# Patient Record
Sex: Female | Born: 1985 | Race: White | Hispanic: No | Marital: Married | Smoking: Current some day smoker
Health system: Southern US, Community
[De-identification: ages and names within clinical notes are randomized; demographics above are authoritative.]

## PROBLEM LIST (undated history)

## (undated) ENCOUNTER — Inpatient Hospital Stay (HOSPITAL_COMMUNITY): Payer: Self-pay

## (undated) DIAGNOSIS — Z789 Other specified health status: Secondary | ICD-10-CM

## (undated) DIAGNOSIS — D696 Thrombocytopenia, unspecified: Secondary | ICD-10-CM

## (undated) HISTORY — PX: NO PAST SURGERIES: SHX2092

## (undated) HISTORY — PX: OTHER SURGICAL HISTORY: SHX169

---

## 2006-02-19 ENCOUNTER — Emergency Department (HOSPITAL_COMMUNITY): Admission: EM | Admit: 2006-02-19 | Discharge: 2006-02-19 | Payer: Self-pay | Admitting: Emergency Medicine

## 2007-09-11 ENCOUNTER — Emergency Department (HOSPITAL_COMMUNITY): Admission: EM | Admit: 2007-09-11 | Discharge: 2007-09-12 | Payer: Self-pay | Admitting: Emergency Medicine

## 2009-01-06 ENCOUNTER — Ambulatory Visit (HOSPITAL_COMMUNITY): Admission: RE | Admit: 2009-01-06 | Discharge: 2009-01-06 | Payer: Self-pay | Admitting: Obstetrics

## 2009-03-30 ENCOUNTER — Ambulatory Visit (HOSPITAL_COMMUNITY): Admission: RE | Admit: 2009-03-30 | Discharge: 2009-03-30 | Payer: Self-pay | Admitting: Obstetrics

## 2009-04-23 ENCOUNTER — Inpatient Hospital Stay (HOSPITAL_COMMUNITY): Admission: AD | Admit: 2009-04-23 | Discharge: 2009-04-23 | Payer: Self-pay | Admitting: Obstetrics

## 2009-05-18 ENCOUNTER — Inpatient Hospital Stay (HOSPITAL_COMMUNITY): Admission: AD | Admit: 2009-05-18 | Discharge: 2009-05-18 | Payer: Self-pay | Admitting: Obstetrics

## 2009-06-07 ENCOUNTER — Inpatient Hospital Stay (HOSPITAL_COMMUNITY): Admission: RE | Admit: 2009-06-07 | Discharge: 2009-06-10 | Payer: Self-pay | Admitting: Obstetrics

## 2010-09-25 LAB — CBC
Hemoglobin: 10.8 g/dL — ABNORMAL LOW (ref 12.0–15.0)
MCV: 93.6 fL (ref 78.0–100.0)
Platelets: 62 10*3/uL — ABNORMAL LOW (ref 150–400)
RBC: 3.48 MIL/uL — ABNORMAL LOW (ref 3.87–5.11)
WBC: 12 10*3/uL — ABNORMAL HIGH (ref 4.0–10.5)

## 2010-09-25 LAB — PLATELET COUNT: Platelets: 80 10*3/uL — ABNORMAL LOW (ref 150–400)

## 2010-09-26 LAB — CBC
HCT: 31.9 % — ABNORMAL LOW (ref 36.0–46.0)
HCT: 33 % — ABNORMAL LOW (ref 36.0–46.0)
HCT: 36.6 % (ref 36.0–46.0)
Hemoglobin: 12.4 g/dL (ref 12.0–15.0)
MCHC: 33.8 g/dL (ref 30.0–36.0)
MCHC: 34.2 g/dL (ref 30.0–36.0)
MCV: 92.1 fL (ref 78.0–100.0)
MCV: 92.8 fL (ref 78.0–100.0)
Platelets: 55 10*3/uL — ABNORMAL LOW (ref 150–400)
Platelets: 62 10*3/uL — ABNORMAL LOW (ref 150–400)
Platelets: 77 10*3/uL — ABNORMAL LOW (ref 150–400)
RBC: 3.45 MIL/uL — ABNORMAL LOW (ref 3.87–5.11)
RDW: 13.1 % (ref 11.5–15.5)
RDW: 13.2 % (ref 11.5–15.5)
RDW: 13.3 % (ref 11.5–15.5)
WBC: 19.9 10*3/uL — ABNORMAL HIGH (ref 4.0–10.5)

## 2010-09-28 LAB — URINALYSIS, ROUTINE W REFLEX MICROSCOPIC: pH: 5 (ref 5.0–8.0)

## 2010-09-28 LAB — WET PREP, GENITAL: Trich, Wet Prep: NONE SEEN

## 2010-09-28 LAB — FETAL FIBRONECTIN: Fetal Fibronectin: NEGATIVE

## 2010-09-28 LAB — GC/CHLAMYDIA PROBE AMP, GENITAL: GC Probe Amp, Genital: NEGATIVE

## 2012-02-19 ENCOUNTER — Encounter (HOSPITAL_COMMUNITY): Payer: Self-pay | Admitting: *Deleted

## 2012-02-19 ENCOUNTER — Inpatient Hospital Stay (HOSPITAL_COMMUNITY)
Admission: AD | Admit: 2012-02-19 | Discharge: 2012-02-19 | Disposition: A | Discharge status: Home / Self Care | Payer: Medicaid Other | Source: Ambulatory Visit | Attending: Obstetrics & Gynecology | Admitting: Obstetrics & Gynecology

## 2012-02-19 DIAGNOSIS — O210 Mild hyperemesis gravidarum: Secondary | ICD-10-CM | POA: Insufficient documentation

## 2012-02-19 DIAGNOSIS — Z3201 Encounter for pregnancy test, result positive: Secondary | ICD-10-CM

## 2012-02-19 HISTORY — DX: Other specified health status: Z78.9

## 2012-02-19 MED ORDER — METOCLOPRAMIDE HCL 10 MG PO TABS: 10.0000 mg | ORAL_TABLET | Freq: Four times a day (QID) | ORAL | Status: DC

## 2012-02-19 NOTE — MAU Provider Note (Signed)
  History     CSN: [id number]  Arrival date and time: 02/19/12 1353   None     Chief Complaint  Patient presents with  . Possible Pregnancy  . Nausea   HPI 26 y.o. G2P1001 at [redacted]w[redacted]d requesting pregnancy verification, C/O nausea, occasional vomiting, able to eat and drink normally.   Past Medical History  Diagnosis Date  . No pertinent past medical history     Past Surgical History  Procedure Date  . No past surgeries     History reviewed. No pertinent family history.  History  Substance Use Topics  . Smoking status: Former Games developer  . Smokeless tobacco: Not on file  . Alcohol Use: Yes     occasional wine    Allergies: No Known Allergies  No prescriptions prior to admission    Review of Systems  Constitutional: Negative.   Respiratory: Negative.   Cardiovascular: Negative.   Gastrointestinal: Negative for nausea, vomiting, abdominal pain, diarrhea and constipation.  Genitourinary: Negative for dysuria, urgency, frequency, hematuria and flank pain.       Negative for vaginal bleeding, vaginal discharge, dyspareunia  Musculoskeletal: Negative.   Neurological: Negative.   Psychiatric/Behavioral: Negative.    Physical Exam   Blood pressure 115/67, pulse 83, temperature 98.3 F (36.8 C), temperature source Oral, resp. rate 16, height 5\' 7"  (1.702 m), weight 130 lb (58.968 kg), last menstrual period 12/09/2011, SpO2 100.00%.  Physical Exam  Nursing note and vitals reviewed. Constitutional: She is oriented to person, place, and time. She appears well-developed and well-nourished. No distress.  Cardiovascular: Normal rate.   Respiratory: Effort normal.  Musculoskeletal: Normal range of motion.  Neurological: She is alert and oriented to person, place, and time.  Skin: Skin is warm and dry.  Psychiatric: She has a normal mood and affect.    MAU Course  Procedures  Results for orders placed during the hospital encounter of 02/19/12 (from the past 72 hour(s))    POCT PREGNANCY, URINE     Status: Abnormal   Collection Time   02/19/12  2:32 PM      Component Value Range Comment   Preg Test, Ur POSITIVE (*) NEGATIVE      Assessment and Plan  25 y.o. G2P1001 at 110w2d Nausea of pregnancy - rx reglan 10 mg i po qid PRN n/v Pregnancy verification given, start prenatal care as soon as possible  Shelagh Rayman 02/19/2012, 3:03 PM

## 2012-02-19 NOTE — MAU Note (Signed)
Patient states she had had multiple positive home pregnancy tests. Has had nausea and one episode of vomiting this am. Needs a confirmation letter to apply for Medicaid. No pain or bleeding.

## 2012-02-19 NOTE — MAU Provider Note (Signed)
Attestation of Attending Supervision of Advanced Practitioner (CNM/NP): Evaluation and management procedures were performed by the Advanced Practitioner under my supervision and collaboration.  I have reviewed the Advanced Practitioner's note and chart, and I agree with the management and plan.  HARRAWAY-SMITH, Kenzey Birkland 6:51 PM     

## 2012-04-14 LAB — OB RESULTS CONSOLE ANTIBODY SCREEN: Antibody Screen: NEGATIVE

## 2012-04-14 LAB — OB RESULTS CONSOLE ABO/RH: RH Type: POSITIVE

## 2012-04-14 LAB — OB RESULTS CONSOLE HEPATITIS B SURFACE ANTIGEN: Hepatitis B Surface Ag: NEGATIVE

## 2012-04-14 LAB — OB RESULTS CONSOLE RUBELLA ANTIBODY, IGM: Rubella: IMMUNE

## 2012-06-25 NOTE — L&D Delivery Note (Signed)
Patient was C/C/+1 and pushed for 5 minutes with no epidural.   NSVD female infant, Apgars 8/9, weight pending.   The patient had a second degree laceration repaired with vicryl. Fundus was firm. EBL was expected. Placenta was delivered intact. Vagina was clear.  Baby was vigorous to bedside.  Philip Aspen

## 2012-09-19 ENCOUNTER — Encounter (HOSPITAL_COMMUNITY): Payer: Self-pay | Admitting: *Deleted

## 2012-09-19 ENCOUNTER — Inpatient Hospital Stay (HOSPITAL_COMMUNITY)
Admission: AD | Admit: 2012-09-19 | Discharge: 2012-09-20 | DRG: 775 | Disposition: A | Discharge status: Home / Self Care | Payer: Medicaid Other | Source: Ambulatory Visit | Attending: Obstetrics and Gynecology | Admitting: Obstetrics and Gynecology

## 2012-09-19 DIAGNOSIS — O9912 Other diseases of the blood and blood-forming organs and certain disorders involving the immune mechanism complicating childbirth: Secondary | ICD-10-CM | POA: Diagnosis present

## 2012-09-19 DIAGNOSIS — D696 Thrombocytopenia, unspecified: Secondary | ICD-10-CM | POA: Diagnosis present

## 2012-09-19 DIAGNOSIS — D689 Coagulation defect, unspecified: Secondary | ICD-10-CM | POA: Diagnosis present

## 2012-09-19 DIAGNOSIS — Z3480 Encounter for supervision of other normal pregnancy, unspecified trimester: Secondary | ICD-10-CM

## 2012-09-19 HISTORY — DX: Other specified health status: Z78.9

## 2012-09-19 HISTORY — DX: Thrombocytopenia, unspecified: D69.6

## 2012-09-19 LAB — CBC
Hemoglobin: 11.9 g/dL — ABNORMAL LOW (ref 12.0–15.0)
MCH: 29.4 pg (ref 26.0–34.0)
MCV: 86.7 fL (ref 78.0–100.0)
Platelets: 78 10*3/uL — ABNORMAL LOW (ref 150–400)
RBC: 4.05 MIL/uL (ref 3.87–5.11)
RDW: 13 % (ref 11.5–15.5)

## 2012-09-19 LAB — PREPARE RBC (CROSSMATCH)

## 2012-09-19 MED ORDER — ACETAMINOPHEN 325 MG PO TABS: 650.0000 mg | ORAL_TABLET | ORAL | Status: DC | PRN

## 2012-09-19 MED ORDER — SIMETHICONE 80 MG PO CHEW: 80.0000 mg | CHEWABLE_TABLET | ORAL | Status: DC | PRN

## 2012-09-19 MED ORDER — FLEET ENEMA 7-19 GM/118ML RE ENEM: 1.0000 | ENEMA | Freq: Once | RECTAL | Status: DC

## 2012-09-19 MED ORDER — BUTORPHANOL TARTRATE 1 MG/ML IJ SOLN
1.0000 mg | INTRAMUSCULAR | Status: DC | PRN
  Administered 2012-09-19: 1 mg via INTRAVENOUS
  Filled 2012-09-19: qty 1

## 2012-09-19 MED ORDER — EPHEDRINE 5 MG/ML INJ
10.0000 mg | INTRAVENOUS | Status: DC | PRN
  Filled 2012-09-19: qty 2
  Filled 2012-09-19 (×2): qty 4

## 2012-09-19 MED ORDER — LIDOCAINE HCL (PF) 1 % IJ SOLN
30.0000 mL | INTRAMUSCULAR | Status: DC | PRN
  Administered 2012-09-19: 30 mL via SUBCUTANEOUS
  Filled 2012-09-19 (×2): qty 30

## 2012-09-19 MED ORDER — PHENYLEPHRINE 40 MCG/ML (10ML) SYRINGE FOR IV PUSH (FOR BLOOD PRESSURE SUPPORT)
80.0000 ug | PREFILLED_SYRINGE | INTRAVENOUS | Status: DC | PRN
  Filled 2012-09-19 (×2): qty 5
  Filled 2012-09-19: qty 2

## 2012-09-19 MED ORDER — WITCH HAZEL-GLYCERIN EX PADS: 1.0000 "application " | MEDICATED_PAD | CUTANEOUS | Status: DC | PRN

## 2012-09-19 MED ORDER — OXYTOCIN BOLUS FROM INFUSION: 500.0000 mL | INTRAVENOUS | Status: DC

## 2012-09-19 MED ORDER — OXYCODONE-ACETAMINOPHEN 5-325 MG PO TABS
1.0000 | ORAL_TABLET | ORAL | Status: DC | PRN
  Administered 2012-09-19 – 2012-09-20 (×4): 1 via ORAL
  Filled 2012-09-19 (×4): qty 1

## 2012-09-19 MED ORDER — OXYCODONE-ACETAMINOPHEN 5-325 MG PO TABS
1.0000 | ORAL_TABLET | ORAL | Status: DC | PRN
  Administered 2012-09-19 (×2): 1 via ORAL
  Filled 2012-09-19 (×2): qty 1

## 2012-09-19 MED ORDER — PRENATAL MULTIVITAMIN CH
1.0000 | ORAL_TABLET | Freq: Every day | ORAL | Status: DC
  Administered 2012-09-19 – 2012-09-20 (×2): 1 via ORAL
  Filled 2012-09-19 (×3): qty 1

## 2012-09-19 MED ORDER — DIPHENHYDRAMINE HCL 12.5 MG/5ML PO ELIX
12.5000 mg | ORAL_SOLUTION | Freq: Four times a day (QID) | ORAL | Status: DC | PRN
  Filled 2012-09-19: qty 5

## 2012-09-19 MED ORDER — ONDANSETRON HCL 4 MG/2ML IJ SOLN: 4.0000 mg | INTRAMUSCULAR | Status: DC | PRN

## 2012-09-19 MED ORDER — CITRIC ACID-SODIUM CITRATE 334-500 MG/5ML PO SOLN: 30.0000 mL | ORAL | Status: DC | PRN

## 2012-09-19 MED ORDER — DIPHENHYDRAMINE HCL 25 MG PO CAPS: 25.0000 mg | ORAL_CAPSULE | Freq: Four times a day (QID) | ORAL | Status: DC | PRN

## 2012-09-19 MED ORDER — ONDANSETRON HCL 4 MG/2ML IJ SOLN
4.0000 mg | Freq: Four times a day (QID) | INTRAMUSCULAR | Status: DC | PRN
Start: 2012-09-19 — End: 2012-09-19

## 2012-09-19 MED ORDER — BENZOCAINE-MENTHOL 20-0.5 % EX AERO
1.0000 "application " | INHALATION_SPRAY | CUTANEOUS | Status: DC | PRN
  Filled 2012-09-19: qty 56

## 2012-09-19 MED ORDER — IBUPROFEN 600 MG PO TABS
600.0000 mg | ORAL_TABLET | Freq: Four times a day (QID) | ORAL | Status: DC
  Administered 2012-09-19: 600 mg via ORAL
  Filled 2012-09-19: qty 1

## 2012-09-19 MED ORDER — FENTANYL 10 MCG/ML IV SOLN
INTRAVENOUS | Status: DC
  Administered 2012-09-19: 05:00:00 via INTRAVENOUS
  Filled 2012-09-19: qty 50

## 2012-09-19 MED ORDER — OXYTOCIN 40 UNITS IN LACTATED RINGERS INFUSION - SIMPLE MED
62.5000 mL/h | INTRAVENOUS | Status: DC
  Administered 2012-09-19: 62.5 mL/h via INTRAVENOUS
  Filled 2012-09-19: qty 1000

## 2012-09-19 MED ORDER — ONDANSETRON HCL 4 MG/2ML IJ SOLN: 4.0000 mg | Freq: Four times a day (QID) | INTRAMUSCULAR | Status: DC | PRN

## 2012-09-19 MED ORDER — DIPHENHYDRAMINE HCL 50 MG/ML IJ SOLN: 12.5000 mg | INTRAMUSCULAR | Status: DC | PRN

## 2012-09-19 MED ORDER — ONDANSETRON HCL 4 MG PO TABS: 4.0000 mg | ORAL_TABLET | ORAL | Status: DC | PRN

## 2012-09-19 MED ORDER — TETANUS-DIPHTH-ACELL PERTUSSIS 5-2.5-18.5 LF-MCG/0.5 IM SUSP
0.5000 mL | Freq: Once | INTRAMUSCULAR | Status: AC
  Administered 2012-09-20: 0.5 mL via INTRAMUSCULAR
  Filled 2012-09-19: qty 0.5

## 2012-09-19 MED ORDER — SENNOSIDES-DOCUSATE SODIUM 8.6-50 MG PO TABS
2.0000 | ORAL_TABLET | Freq: Every day | ORAL | Status: DC
  Administered 2012-09-19: 2 via ORAL

## 2012-09-19 MED ORDER — LACTATED RINGERS IV SOLN: 500.0000 mL | INTRAVENOUS | Status: DC | PRN

## 2012-09-19 MED ORDER — DIPHENHYDRAMINE HCL 50 MG/ML IJ SOLN: 12.5000 mg | Freq: Four times a day (QID) | INTRAMUSCULAR | Status: DC | PRN

## 2012-09-19 MED ORDER — NALOXONE HCL 0.4 MG/ML IJ SOLN: 0.4000 mg | INTRAMUSCULAR | Status: DC | PRN

## 2012-09-19 MED ORDER — LANOLIN HYDROUS EX OINT: TOPICAL_OINTMENT | CUTANEOUS | Status: DC | PRN

## 2012-09-19 MED ORDER — SODIUM CHLORIDE 0.9 % IJ SOLN: 9.0000 mL | INTRAMUSCULAR | Status: DC | PRN

## 2012-09-19 MED ORDER — ZOLPIDEM TARTRATE 5 MG PO TABS: 5.0000 mg | ORAL_TABLET | Freq: Every evening | ORAL | Status: DC | PRN

## 2012-09-19 MED ORDER — FENTANYL 2.5 MCG/ML BUPIVACAINE 1/10 % EPIDURAL INFUSION (WH - ANES)
14.0000 mL/h | INTRAMUSCULAR | Status: DC | PRN
  Filled 2012-09-19 (×2): qty 125

## 2012-09-19 MED ORDER — PHENYLEPHRINE 40 MCG/ML (10ML) SYRINGE FOR IV PUSH (FOR BLOOD PRESSURE SUPPORT)
80.0000 ug | PREFILLED_SYRINGE | INTRAVENOUS | Status: DC | PRN
  Filled 2012-09-19: qty 2

## 2012-09-19 MED ORDER — DIBUCAINE 1 % RE OINT: 1.0000 "application " | TOPICAL_OINTMENT | RECTAL | Status: DC | PRN

## 2012-09-19 MED ORDER — LACTATED RINGERS IV SOLN: 500.0000 mL | Freq: Once | INTRAVENOUS | Status: DC

## 2012-09-19 MED ORDER — IBUPROFEN 600 MG PO TABS
600.0000 mg | ORAL_TABLET | Freq: Four times a day (QID) | ORAL | Status: DC | PRN
  Administered 2012-09-19: 600 mg via ORAL
  Filled 2012-09-19: qty 1

## 2012-09-19 MED ORDER — EPHEDRINE 5 MG/ML INJ
10.0000 mg | INTRAVENOUS | Status: DC | PRN
  Filled 2012-09-19: qty 2

## 2012-09-19 MED ORDER — LACTATED RINGERS IV SOLN
INTRAVENOUS | Status: DC
  Administered 2012-09-19 (×2): via INTRAVENOUS

## 2012-09-19 NOTE — MAU Note (Signed)
Pt. Started having hard regular uc's at 2300, pt. Came in for labor eval

## 2012-09-19 NOTE — Progress Notes (Signed)
UR chart review completed.  

## 2012-09-19 NOTE — H&P (Addendum)
27 y.o. 108w5d  G2P2002 comes in c/o ctx.  Otherwise has good fetal movement and no bleeding.  Past Medical History  Diagnosis Date  . No pertinent past medical history   . Medical history non-contributory   . Thrombocytopenia     with last pregnancy    Past Surgical History  Procedure Laterality Date  . No past surgeries      OB History   Grav Para Term Preterm Abortions TAB SAB Ect Mult Living   2 2 2       2      # Outc Date GA Lbr Len/2nd Wgt Sex Del Anes PTL Lv   1 TRM 3/14 [redacted]w[redacted]d 07:18 / 00:14  F SVD Other  Yes   2 TRM               History   Social History  . Marital Status: Single    Spouse Name: N/A    Number of Children: N/A  . Years of Education: N/A   Occupational History  . Not on file.   Social History Main Topics  . Smoking status: Former Games developer  . Smokeless tobacco: Not on file  . Alcohol Use: Yes     Comment: occasional wine  . Drug Use: No  . Sexually Active: Yes   Other Topics Concern  . Not on file   Social History Narrative  . No narrative on file   Review of patient's allergies indicates no known allergies.    Prenatal Transfer Tool  Maternal Diabetes: No Genetic Screening: Declined Fetal Ultrasounds or other Referrals:  Other:  nl anatomy scan Maternal Substance Abuse:  No Significant Maternal Medications:  Due to thrombocytopenia pt did not receive epidural, Dr. Henderson Cloud ordered a fentanyl PCA, NICU present at delivery and baby not requiring any tx at that time. Significant Maternal Lab Results: Lab values include: Group B Strep negative  Other PNC: hx of gestational thrombocytopenia last pregnancy, down to 70, received epidural.  Initial PNL showed plt 121.  Repeat at admission 78.  Anesthesia refused to place epidural due to potential bleeding risk.    Filed Vitals:   09/19/12 0700  BP: 125/74  Pulse: 103  Temp:   Resp: 20     Lungs/Cor:  NAD Abdomen:  soft, gravid Ex:  no cords, erythema SVE:  5/90/-1 at presentation,  complete when alerted by RN FHTs:  135, good STV, NST R Toco:  q 2-4   A/P   Admit to L&D Epidural desired, unable to receive due to low platelets.   GBS Neg  Dana Daugherty

## 2012-09-19 NOTE — Progress Notes (Signed)
Spoke with Dr. Dareen Piano via phone regarding patient's order for motrin and her admission platelet count of 78. Per Dr. Dareen Piano, it is okay for patient to have motrin as ordered. Will continue to monitor patient.

## 2012-09-20 LAB — CBC
Hemoglobin: 10.3 g/dL — ABNORMAL LOW (ref 12.0–15.0)
MCV: 87.6 fL (ref 78.0–100.0)
Platelets: 84 10*3/uL — ABNORMAL LOW (ref 150–400)
RBC: 3.56 MIL/uL — ABNORMAL LOW (ref 3.87–5.11)
WBC: 11.7 10*3/uL — ABNORMAL HIGH (ref 4.0–10.5)

## 2012-09-20 MED ORDER — IBUPROFEN 600 MG PO TABS: 600.0000 mg | ORAL_TABLET | Freq: Four times a day (QID) | ORAL | Status: DC

## 2012-09-20 MED ORDER — OXYCODONE-ACETAMINOPHEN 5-325 MG PO TABS: 1.0000 | ORAL_TABLET | ORAL | Status: DC | PRN

## 2012-09-20 NOTE — Discharge Summary (Signed)
Obstetric Discharge Summary Reason for Admission: onset of labor Prenatal Procedures: ultrasound Intrapartum Procedures: spontaneous vaginal delivery Postpartum Procedures: none Complications-Operative and Postpartum: 2 degree perineal laceration Hemoglobin  Date Value Range Status  09/20/2012 10.3* 12.0 - 15.0 g/dL Final     HCT  Date Value Range Status  09/20/2012 31.2* 36.0 - 46.0 % Final    Physical Exam:  General: alert Lochia: appropriate Uterine Fundus: firm  Discharge Diagnoses: Term Pregnancy-delivered and thrombocytopenia  Discharge Information: Date: 09/20/2012 Activity: pelvic rest Diet: routine Medications: PNV, Ibuprofen and Percocet Condition: stable Instructions: refer to practice specific booklet Discharge to: home Follow-up Information   Follow up with CALLAHAN, SIDNEY, DO. Schedule an appointment as soon as possible for a visit in 4 weeks.   Contact information:   [address] [place] [address] [phone]       Newborn Data: Live born female  Birth Weight: 8 lb 1.8 oz (3680 g) APGAR: 8, 9  Home with mother.  ANDERSON,MARK E 09/20/2012, 11:19 AM

## 2012-09-20 NOTE — Progress Notes (Signed)
PPD#1 Pt and baby are doing well. She would like to go home early. Lochia-mod. Adequate pain control. VSSAF IMP/ stable  PLAN/ will discharge.

## 2012-09-22 LAB — TYPE AND SCREEN
ABO/RH(D): A POS
Antibody Screen: NEGATIVE
Unit division: 0
Unit division: 0
Unit division: 0

## 2014-03-25 ENCOUNTER — Telehealth: Payer: Self-pay

## 2014-03-25 ENCOUNTER — Telehealth: Payer: Self-pay | Admitting: Hematology

## 2014-03-25 NOTE — Telephone Encounter (Signed)
C/D 03/25/14 for appt. 04/16/14

## 2014-03-25 NOTE — Telephone Encounter (Signed)
S/W PT IN REF TO NP APPT. ON 04/16/14@10 :30 REFERRING DR Claiborne BillingsALLAHAN DX-THROMBOCYOPENIA

## 2014-04-13 ENCOUNTER — Ambulatory Visit: Payer: Medicaid Other

## 2014-04-13 ENCOUNTER — Other Ambulatory Visit: Payer: Medicaid Other

## 2014-04-16 ENCOUNTER — Other Ambulatory Visit: Payer: Medicaid Other

## 2014-04-16 ENCOUNTER — Ambulatory Visit: Payer: Medicaid Other

## 2014-04-26 ENCOUNTER — Encounter (HOSPITAL_COMMUNITY): Payer: Self-pay | Admitting: *Deleted

## 2014-04-26 ENCOUNTER — Other Ambulatory Visit: Payer: Medicaid Other

## 2014-04-26 ENCOUNTER — Ambulatory Visit: Payer: Medicaid Other

## 2014-06-14 ENCOUNTER — Emergency Department: Payer: Self-pay | Admitting: Emergency Medicine

## 2014-06-14 LAB — URINALYSIS, COMPLETE
Bilirubin,UR: NEGATIVE
GLUCOSE, UR: NEGATIVE mg/dL (ref 0–75)
KETONE: NEGATIVE
Nitrite: POSITIVE
PH: 6 (ref 4.5–8.0)
Protein: 30
Specific Gravity: 1.014 (ref 1.003–1.030)
WBC UR: 135 /HPF (ref 0–5)

## 2014-06-15 ENCOUNTER — Emergency Department: Payer: Self-pay | Admitting: Emergency Medicine

## 2014-06-15 LAB — CBC WITH DIFFERENTIAL/PLATELET
BASOS ABS: 0 10*3/uL (ref 0.0–0.1)
Basophil %: 0.1 %
EOS ABS: 0 10*3/uL (ref 0.0–0.7)
EOS PCT: 0.4 %
HCT: 38 % (ref 35.0–47.0)
HGB: 12.1 g/dL (ref 12.0–16.0)
LYMPHS PCT: 4.2 %
Lymphocyte #: 0.5 10*3/uL — ABNORMAL LOW (ref 1.0–3.6)
MCH: 28 pg (ref 26.0–34.0)
MCHC: 31.9 g/dL — AB (ref 32.0–36.0)
MCV: 88 fL (ref 80–100)
Monocyte #: 1.3 x10 3/mm — ABNORMAL HIGH (ref 0.2–0.9)
Monocyte %: 11.4 %
NEUTROS ABS: 9.4 10*3/uL — AB (ref 1.4–6.5)
NEUTROS PCT: 83.9 %
PLATELETS: 76 10*3/uL — AB (ref 150–440)
RBC: 4.33 10*6/uL (ref 3.80–5.20)
RDW: 13.5 % (ref 11.5–14.5)
WBC: 11.2 10*3/uL — AB (ref 3.6–11.0)

## 2014-06-15 LAB — BASIC METABOLIC PANEL
Anion Gap: 8 (ref 7–16)
BUN: 10 mg/dL (ref 7–18)
CALCIUM: 8.6 mg/dL (ref 8.5–10.1)
CO2: 22 mmol/L (ref 21–32)
Chloride: 108 mmol/L — ABNORMAL HIGH (ref 98–107)
Creatinine: 0.72 mg/dL (ref 0.60–1.30)
EGFR (African American): >60
GLUCOSE: 135 mg/dL — AB (ref 65–99)
OSMOLALITY: 277 (ref 275–301)
Potassium: 3.4 mmol/L — ABNORMAL LOW (ref 3.5–5.1)
SODIUM: 138 mmol/L (ref 136–145)

## 2015-03-23 ENCOUNTER — Encounter: Payer: Self-pay | Admitting: Obstetrics and Gynecology

## 2015-03-23 ENCOUNTER — Emergency Department: Payer: 59 | Admitting: Anesthesiology

## 2015-03-23 ENCOUNTER — Encounter: Payer: Self-pay | Admitting: Emergency Medicine

## 2015-03-23 ENCOUNTER — Emergency Department
Admission: EM | Admit: 2015-03-23 | Discharge: 2015-03-23 | Disposition: A | Discharge status: Home / Self Care | Payer: 59 | Attending: Emergency Medicine | Admitting: Emergency Medicine

## 2015-03-23 ENCOUNTER — Emergency Department: Payer: 59

## 2015-03-23 ENCOUNTER — Encounter
Admission: EM | Disposition: A | Discharge status: Home / Self Care | Payer: Self-pay | Source: Home / Self Care | Attending: Emergency Medicine

## 2015-03-23 DIAGNOSIS — N838 Other noninflammatory disorders of ovary, fallopian tube and broad ligament: Secondary | ICD-10-CM | POA: Diagnosis not present

## 2015-03-23 DIAGNOSIS — K661 Hemoperitoneum: Secondary | ICD-10-CM | POA: Diagnosis not present

## 2015-03-23 DIAGNOSIS — R102 Pelvic and perineal pain: Secondary | ICD-10-CM | POA: Insufficient documentation

## 2015-03-23 DIAGNOSIS — R1084 Generalized abdominal pain: Secondary | ICD-10-CM | POA: Diagnosis present

## 2015-03-23 DIAGNOSIS — R1030 Lower abdominal pain, unspecified: Secondary | ICD-10-CM | POA: Diagnosis not present

## 2015-03-23 DIAGNOSIS — Z862 Personal history of diseases of the blood and blood-forming organs and certain disorders involving the immune mechanism: Secondary | ICD-10-CM | POA: Insufficient documentation

## 2015-03-23 DIAGNOSIS — Z87891 Personal history of nicotine dependence: Secondary | ICD-10-CM | POA: Insufficient documentation

## 2015-03-23 DIAGNOSIS — Z79899 Other long term (current) drug therapy: Secondary | ICD-10-CM | POA: Insufficient documentation

## 2015-03-23 HISTORY — PX: LAPAROSCOPY: SHX197

## 2015-03-23 LAB — URINALYSIS COMPLETE WITH MICROSCOPIC (ARMC ONLY)
Bilirubin Urine: NEGATIVE
Glucose, UA: NEGATIVE mg/dL
HGB URINE DIPSTICK: NEGATIVE
Ketones, ur: NEGATIVE mg/dL
LEUKOCYTES UA: NEGATIVE
NITRITE: NEGATIVE
PROTEIN: NEGATIVE mg/dL
SPECIFIC GRAVITY, URINE: 1.026 (ref 1.005–1.030)
pH: 5 (ref 5.0–8.0)

## 2015-03-23 LAB — CBC WITH DIFFERENTIAL/PLATELET
BASOS ABS: 0 10*3/uL (ref 0–0.1)
Eosinophils Absolute: 0.1 10*3/uL (ref 0–0.7)
Eosinophils Relative: 0 %
HEMATOCRIT: 34.5 % — AB (ref 35.0–47.0)
HEMOGLOBIN: 11.2 g/dL — AB (ref 12.0–16.0)
Lymphocytes Relative: 11 %
Lymphs Abs: 1.8 10*3/uL (ref 1.0–3.6)
MCH: 28.2 pg (ref 26.0–34.0)
MCHC: 32.5 g/dL (ref 32.0–36.0)
MCV: 86.8 fL (ref 80.0–100.0)
Monocytes Absolute: 1.5 10*3/uL — ABNORMAL HIGH (ref 0.2–0.9)
NEUTROS ABS: 12.2 10*3/uL — AB (ref 1.4–6.5)
Platelets: 98 10*3/uL — ABNORMAL LOW (ref 150–440)
RBC: 3.98 MIL/uL (ref 3.80–5.20)
RDW: 13.7 % (ref 11.5–14.5)
WBC: 15.6 10*3/uL — AB (ref 3.6–11.0)

## 2015-03-23 LAB — CBC
HCT: 29.7 % — ABNORMAL LOW (ref 35.0–47.0)
Hemoglobin: 9.7 g/dL — ABNORMAL LOW (ref 12.0–16.0)
MCH: 28.8 pg (ref 26.0–34.0)
MCHC: 32.5 g/dL (ref 32.0–36.0)
MCV: 88.5 fL (ref 80.0–100.0)
Platelets: 85 10*3/uL — ABNORMAL LOW (ref 150–440)
RBC: 3.36 MIL/uL — ABNORMAL LOW (ref 3.80–5.20)
RDW: 14 % (ref 11.5–14.5)
WBC: 14.6 10*3/uL — ABNORMAL HIGH (ref 3.6–11.0)

## 2015-03-23 LAB — COMPREHENSIVE METABOLIC PANEL
ALK PHOS: 59 U/L (ref 38–126)
ALT: 6 U/L — ABNORMAL LOW (ref 14–54)
ANION GAP: 10 (ref 5–15)
AST: 26 U/L (ref 15–41)
Albumin: 4.3 g/dL (ref 3.5–5.0)
BUN: 15 mg/dL (ref 6–20)
CALCIUM: 9 mg/dL (ref 8.9–10.3)
CO2: 22 mmol/L (ref 22–32)
Chloride: 107 mmol/L (ref 101–111)
Creatinine, Ser: 0.68 mg/dL (ref 0.44–1.00)
GFR calc non Af Amer: >60 mL/min (ref >60)
Glucose, Bld: 114 mg/dL — ABNORMAL HIGH (ref 65–99)
Potassium: 3.9 mmol/L (ref 3.5–5.1)
SODIUM: 139 mmol/L (ref 135–145)
TOTAL PROTEIN: 7.3 g/dL (ref 6.5–8.1)
Total Bilirubin: 0.7 mg/dL (ref 0.3–1.2)

## 2015-03-23 LAB — TYPE AND SCREEN
ABO/RH(D): A POS
Antibody Screen: NEGATIVE

## 2015-03-23 LAB — ABO/RH: ABO/RH(D): A POS

## 2015-03-23 LAB — POCT PREGNANCY, URINE: Preg Test, Ur: NEGATIVE

## 2015-03-23 LAB — HCG, QUANTITATIVE, PREGNANCY: HCG, BETA CHAIN, QUANT, S: 1 m[IU]/mL (ref <5)

## 2015-03-23 SURGERY — LAPAROSCOPY, DIAGNOSTIC
Anesthesia: General | Site: Abdomen | Wound class: Clean Contaminated

## 2015-03-23 MED ORDER — MORPHINE SULFATE (PF) 4 MG/ML IV SOLN
4.0000 mg | Freq: Once | INTRAVENOUS | Status: AC
  Administered 2015-03-23: 4 mg via INTRAVENOUS

## 2015-03-23 MED ORDER — PROMETHAZINE HCL 25 MG/ML IJ SOLN
INTRAMUSCULAR | Status: AC
  Administered 2015-03-23: 6.25 mg via INTRAVENOUS
  Filled 2015-03-23: qty 1

## 2015-03-23 MED ORDER — ONDANSETRON HCL 4 MG/2ML IJ SOLN
4.0000 mg | Freq: Once | INTRAMUSCULAR | Status: AC
  Administered 2015-03-23: 4 mg via INTRAVENOUS
  Filled 2015-03-23: qty 2

## 2015-03-23 MED ORDER — MORPHINE SULFATE (PF) 4 MG/ML IV SOLN
4.0000 mg | Freq: Once | INTRAVENOUS | Status: AC
  Administered 2015-03-23: 4 mg via INTRAVENOUS
  Filled 2015-03-23: qty 1

## 2015-03-23 MED ORDER — BUPIVACAINE-EPINEPHRINE 0.25% -1:200000 IJ SOLN
INTRAMUSCULAR | Status: DC | PRN
  Administered 2015-03-23: 20 mL

## 2015-03-23 MED ORDER — FERROUS SULFATE 325 (65 FE) MG PO TABS: 325.0000 mg | ORAL_TABLET | Freq: Two times a day (BID) | ORAL | Status: DC

## 2015-03-23 MED ORDER — ONDANSETRON HCL 4 MG/2ML IJ SOLN
INTRAMUSCULAR | Status: DC | PRN
  Administered 2015-03-23: 4 mg via INTRAVENOUS

## 2015-03-23 MED ORDER — LACTATED RINGERS IV SOLN
INTRAVENOUS | Status: DC
  Administered 2015-03-23 (×2): via INTRAVENOUS

## 2015-03-23 MED ORDER — HYDROCODONE-ACETAMINOPHEN 5-325 MG PO TABS: 1.0000 | ORAL_TABLET | Freq: Four times a day (QID) | ORAL | Status: DC | PRN

## 2015-03-23 MED ORDER — DOCUSATE SODIUM 100 MG PO CAPS: 100.0000 mg | ORAL_CAPSULE | Freq: Two times a day (BID) | ORAL | Status: DC | PRN

## 2015-03-23 MED ORDER — GLYCOPYRROLATE 0.2 MG/ML IJ SOLN
INTRAMUSCULAR | Status: DC | PRN
  Administered 2015-03-23: 0.6 mg via INTRAVENOUS

## 2015-03-23 MED ORDER — SODIUM CHLORIDE 0.9 % IV BOLUS (SEPSIS)
1000.0000 mL | Freq: Once | INTRAVENOUS | Status: AC
  Administered 2015-03-23: 1000 mL via INTRAVENOUS

## 2015-03-23 MED ORDER — PROPOFOL 10 MG/ML IV BOLUS
INTRAVENOUS | Status: DC | PRN
  Administered 2015-03-23: 130 mg via INTRAVENOUS

## 2015-03-23 MED ORDER — DEXAMETHASONE SODIUM PHOSPHATE 4 MG/ML IJ SOLN
INTRAMUSCULAR | Status: DC | PRN
  Administered 2015-03-23: 10 mg via INTRAVENOUS

## 2015-03-23 MED ORDER — OXYCODONE HCL 5 MG/5ML PO SOLN: 5.0000 mg | Freq: Once | ORAL | Status: DC | PRN

## 2015-03-23 MED ORDER — OXYCODONE HCL 5 MG PO TABS: 5.0000 mg | ORAL_TABLET | Freq: Once | ORAL | Status: DC | PRN

## 2015-03-23 MED ORDER — ROCURONIUM BROMIDE 100 MG/10ML IV SOLN
INTRAVENOUS | Status: DC | PRN
  Administered 2015-03-23: 30 mg via INTRAVENOUS
  Administered 2015-03-23: 20 mg via INTRAVENOUS

## 2015-03-23 MED ORDER — NEOSTIGMINE METHYLSULFATE 10 MG/10ML IV SOLN
INTRAVENOUS | Status: DC | PRN
  Administered 2015-03-23: 3 mg via INTRAVENOUS

## 2015-03-23 MED ORDER — PROMETHAZINE HCL 25 MG/ML IJ SOLN
6.2500 mg | Freq: Once | INTRAMUSCULAR | Status: AC
  Administered 2015-03-23: 6.25 mg via INTRAVENOUS

## 2015-03-23 MED ORDER — IBUPROFEN 800 MG PO TABS: 800.0000 mg | ORAL_TABLET | Freq: Three times a day (TID) | ORAL | Status: DC | PRN

## 2015-03-23 MED ORDER — SODIUM CHLORIDE 0.9 % IJ SOLN
INTRAMUSCULAR | Status: AC
  Filled 2015-03-23: qty 10

## 2015-03-23 MED ORDER — MIDAZOLAM HCL 2 MG/2ML IJ SOLN
INTRAMUSCULAR | Status: DC | PRN
  Administered 2015-03-23: 2 mg via INTRAVENOUS

## 2015-03-23 MED ORDER — FENTANYL CITRATE (PF) 100 MCG/2ML IJ SOLN
25.0000 ug | INTRAMUSCULAR | Status: DC | PRN
  Administered 2015-03-23: 25 ug via INTRAVENOUS

## 2015-03-23 MED ORDER — FENTANYL CITRATE (PF) 100 MCG/2ML IJ SOLN
INTRAMUSCULAR | Status: DC | PRN
  Administered 2015-03-23 (×3): 50 ug via INTRAVENOUS
  Administered 2015-03-23: 100 ug via INTRAVENOUS

## 2015-03-23 MED ORDER — LIDOCAINE HCL (CARDIAC) 20 MG/ML IV SOLN
INTRAVENOUS | Status: DC | PRN
  Administered 2015-03-23: 100 mg via INTRAVENOUS

## 2015-03-23 MED ORDER — MORPHINE SULFATE (PF) 4 MG/ML IV SOLN
INTRAVENOUS | Status: AC
  Administered 2015-03-23: 4 mg via INTRAVENOUS
  Filled 2015-03-23: qty 1

## 2015-03-23 SURGICAL SUPPLY — 35 items
BLADE SURG SZ11 CARB STEEL (BLADE) ×3 IMPLANT
CANISTER SUCT 1200ML W/VALVE (MISCELLANEOUS) ×3 IMPLANT
CATH ROBINSON RED A/P 16FR (CATHETERS) ×3 IMPLANT
CHLORAPREP W/TINT 26ML (MISCELLANEOUS) ×3 IMPLANT
CORD MONOPOLAR M/FML 12FT (MISCELLANEOUS) ×3 IMPLANT
DISSECTOR KITTNER STICK (MISCELLANEOUS) IMPLANT
DISSECTORS/KITTNER STICK (MISCELLANEOUS) ×3
GLOVE BIO SURGEON STRL SZ 6 (GLOVE) ×3 IMPLANT
GLOVE BIOGEL PI IND STRL 6.5 (GLOVE) ×1 IMPLANT
GLOVE BIOGEL PI INDICATOR 6.5 (GLOVE) ×8
GOWN STRL REUS W/ TWL LRG LVL3 (GOWN DISPOSABLE) ×2 IMPLANT
GOWN STRL REUS W/TWL LRG LVL3 (GOWN DISPOSABLE) ×6
HEMOSTAT SURGICEL 2X3 (HEMOSTASIS) ×2 IMPLANT
IRRIGATION STRYKERFLOW (MISCELLANEOUS) ×1 IMPLANT
IRRIGATOR STRYKERFLOW (MISCELLANEOUS) ×3
IV LACTATED RINGERS 1000ML (IV SOLUTION) ×3 IMPLANT
KIT RM TURNOVER CYSTO AR (KITS) ×3 IMPLANT
LABEL OR SOLS (LABEL) ×1 IMPLANT
LIQUID BAND (GAUZE/BANDAGES/DRESSINGS) ×3 IMPLANT
NS IRRIG 1000ML POUR BTL (IV SOLUTION) ×3 IMPLANT
NS IRRIG 500ML POUR BTL (IV SOLUTION) ×3 IMPLANT
PACK GYN LAPAROSCOPIC (MISCELLANEOUS) ×3 IMPLANT
PAD OB MATERNITY 4.3X12.25 (PERSONAL CARE ITEMS) ×3 IMPLANT
PAD PREP 24X41 OB/GYN DISP (PERSONAL CARE ITEMS) ×3 IMPLANT
POUCH ENDO CATCH 10MM SPEC (MISCELLANEOUS) ×1 IMPLANT
SCISSORS METZENBAUM CVD 33 (INSTRUMENTS) ×1 IMPLANT
SHEARS HARMONIC ACE PLUS 36CM (ENDOMECHANICALS) ×1 IMPLANT
SLEEVE ENDOPATH XCEL 5M (ENDOMECHANICALS) ×3 IMPLANT
SUT VIC AB 3-0 SH 27 (SUTURE) ×3
SUT VIC AB 3-0 SH 27X BRD (SUTURE) ×1 IMPLANT
SUT VICRYL 0 AB UR-6 (SUTURE) ×3 IMPLANT
TROCAR ENDO BLADELESS 11MM (ENDOMECHANICALS) ×1 IMPLANT
TROCAR XCEL NON-BLD 5MMX100MML (ENDOMECHANICALS) ×1 IMPLANT
TROCAR XCEL UNIV SLVE 11M 100M (ENDOMECHANICALS) ×3 IMPLANT
TUBING INSUFFLATOR HI FLOW (MISCELLANEOUS) ×3 IMPLANT

## 2015-03-23 NOTE — Op Note (Addendum)
Operative Laparoscopy Procedure Note  Indications: The patient is a 29 y.o. P40 female with a large hemoperitoneum of unknown source.  Pre-operative Diagnosis: Large hemoperitoneum  Post-operative Diagnosis: Same, with small punctate right ovarian defect.  Surgeon: Hildred Laser, MD  Assistants: None  Anesthesia: General endotracheal anesthesia  ASA Class: 1  Findings: The anterior cul-de-sac and round ligaments appeared normal.  The uterus was small, 7-8 week size on bimanual exam.  The left adnexa appeared normal.  The right fallopian tube appeared normal.  The left ovary appeared normal except for small punctate defect on posterior surface, with oozing present.  Cul-de-sac was normal, no lesions.  Upper abdomen appeared normal.   Procedure Details  The patient was seen in the Holding Room. The risks, benefits, complications, treatment options, and expected outcomes were discussed with the patient. The possibilities of reaction to medication, pulmonary aspiration, perforation of viscus, bleeding, recurrent infection, the need for additional procedures, failure to diagnose a condition, and creating a complication requiring transfusion or operation were discussed with the patient. The patient concurred with the proposed plan, giving informed consent. The patient was taken to the Operating Room, identified as Dana Daugherty and the procedure verified as Diagnostic Laparoscopy. A Time Out was held and the above information confirmed.  After induction of general anesthesia, the patient was placed in modified dorsal lithotomy position where she was prepped, draped, and catheterized in the normal, sterile fashion.  A sterile speculum was placed into the vagina.  The cervix was visualized and an intrauterine manipulator was placed. A 11mm umbilical incision was then performed. The incision was injected with 10 cc of 0.25% Sensorcaine with epinephrine. The 11-mm Optiview trochar with  sleeve were passed under direct visualization, and once entrance into the abdominal cavity had been confirmed, a pneumoperitoneum was established. The above findings were noted.   Two additional 5 mm lateral port sites were placed under direct visualization.  Each incision site was injected with 5 cc of 0.25% Sensorcaine with epinephrine.   The hemoperitoneum was evacuated using a suction irrigator, with a total of 1300 ml of blood and clots. The abdomen was irrigated and then surveyed thoroughly  to identify the source of bleeding.  The source was noted to be a miniscule area on the right ovary, punctate lesion/defect, with a slow steady trickle of blood.  Unidentified cause of injury to ovary.  The area was coagulated using the Kleppingers (monopolar energy).  Good hemostasis was noted.  The surface of the ovary was then covered with Surgicel.    Following the procedure the laparoscopic port sheaths were removed after intra-abdominal carbon dioxide was expressed. The umbilical fascial incision was closed with a 0-Vicryl in a figure-of-eight fashion. All skin closures were achieved with Liquiband. The intrauterine manipulator was then removed.  Instrument, sponge, and needle counts were correct prior to abdominal closure and at the conclusion of the case.   Estimated Blood Loss:  1300 ml of hemoperitoneum. Minimal surgical blood loss.          Drains: straight catheterization with 10 ml of clear urine at start of procedure.         Total IV Fluids: 1600 mL  Specimens: None             Complications:  None; patient tolerated the procedure well.         Disposition: PACU - hemodynamically stable.         Condition: stable   Hildred Laser, MD [facility]

## 2015-03-23 NOTE — Transfer of Care (Signed)
Immediate Anesthesia Transfer of Care Note  Patient: Dana Daugherty  Procedure(s) Performed: Procedure(s): LAPAROSCOPY DIAGNOSTIC (N/A)  Patient Location: PACU  Anesthesia Type:General  Level of Consciousness: awake and patient cooperative  Airway & Oxygen Therapy: Patient Spontanous Breathing and Patient connected to face mask oxygen  Post-op Assessment: Report given to RN  Post vital signs: Reviewed and stable  Last Vitals:  Filed Vitals:   03/23/15 1729  BP: 108/90  Pulse: 123  Temp: 36.2 C  Resp: 20    Complications: No apparent anesthesia complications

## 2015-03-23 NOTE — ED Notes (Signed)
OR teach at bedside for transfer.

## 2015-03-23 NOTE — Anesthesia Postprocedure Evaluation (Signed)
  Anesthesia Post-op Note  Patient: Dana Daugherty  Procedure(s) Performed: Procedure(s): LAPAROSCOPY DIAGNOSTIC (N/A)  Anesthesia type:General ETT  Patient location: PACU  Post pain: Pain level controlled  Post assessment: Post-op Vital signs reviewed, Patient's Cardiovascular Status Stable, Respiratory Function Stable, Patent Airway and No signs of Nausea or vomiting  Post vital signs: Reviewed and stable  Last Vitals:  Filed Vitals:   03/23/15 1902  BP: 110/76  Pulse: 93  Temp: 37.6 C  Resp: 17    Level of consciousness: awake, alert  and patient cooperative  Complications: No apparent anesthesia complications

## 2015-03-23 NOTE — Anesthesia Preprocedure Evaluation (Signed)
Anesthesia Evaluation  Patient identified by MRN, date of birth, ID band Patient awake    Reviewed: Allergy & Precautions, H&P , NPO status , Patient's Chart, lab work & pertinent test results  Airway Mallampati: II  TM Distance: >3 FB Neck ROM: full    Dental no notable dental hx. (+) Teeth Intact   Pulmonary neg shortness of breath, Current Smoker,    Pulmonary exam normal breath sounds clear to auscultation       Cardiovascular Exercise Tolerance: Good (-) Past MI negative cardio ROS Normal cardiovascular exam Rhythm:regular Rate:Normal     Neuro/Psych negative neurological ROS  negative psych ROS   GI/Hepatic negative GI ROS, Neg liver ROS, neg GERD  ,  Endo/Other  negative endocrine ROS  Renal/GU negative Renal ROS  negative genitourinary   Musculoskeletal   Abdominal   Peds  Hematology negative hematology ROS (+)   Anesthesia Other Findings Past Medical History:   No pertinent past medical history                            Medical history non-contributory                             Thrombocytopenia                                               Comment:with last pregnancy   Reproductive/Obstetrics negative OB ROS                             Anesthesia Physical Anesthesia Plan  ASA: III  Anesthesia Plan: General ETT   Post-op Pain Management:    Induction:   Airway Management Planned:   Additional Equipment:   Intra-op Plan:   Post-operative Plan:   Informed Consent: I have reviewed the patients History and Physical, chart, labs and discussed the procedure including the risks, benefits and alternatives for the proposed anesthesia with the patient or authorized representative who has indicated his/her understanding and acceptance.   Dental Advisory Given  Plan Discussed with: Anesthesiologist, CRNA and Surgeon  Anesthesia Plan Comments:         Anesthesia  Quick Evaluation

## 2015-03-23 NOTE — Discharge Instructions (Signed)
General Gynecological Post-Operative Instructions °You may expect to feel dizzy, weak, and drowsy for as long as 24 hours after receiving the medicine that made you sleep (anesthetic).  °Do not drive a car, ride a bicycle, participate in physical activities, or take public transportation until you are done taking narcotic pain medicines or as directed by your doctor.  °Do not drink alcohol or take tranquilizers.  °Do not take medicine that has not been prescribed by your doctor.  °Do not sign important papers or make important decisions while on narcotic pain medicines.  °Have a responsible person with you.  °CARE OF INCISION  °Keep incision clean and dry. °Take showers instead of baths until your doctor gives you permission to take baths.  °Avoid heavy lifting (more than 10 pounds/4.5 kilograms), pushing, or pulling.  °Avoid activities that may risk injury to your surgical site.  °No sexual intercourse or placement of anything in the vagina for 2 weeks or as instructed by your doctor. °If you have tubes coming from the wound site, check with your doctor regarding appropriate care of the tubes. °Only take prescription or over-the-counter medicines  for pain, discomfort, or fever as directed by your doctor. Do not take aspirin. It can make you bleed. Take medicines (antibiotics) that kill germs if they are prescribed for you.  °Call the office or go to the MAU if:  °You feel sick to your stomach (nauseous).  °You start to throw up (vomit).  °You have trouble eating or drinking.  °You have an oral temperature above 101.  °You have constipation that is not helped by adjusting diet or increasing fluid intake. Pain medicines are a common cause of constipation.  °You have any other concerns. °SEEK IMMEDIATE MEDICAL CARE IF:  °You have persistent dizziness.  °You have difficulty breathing or a congested sounding (croupy) cough.  °You have an oral temperature above 102.5, not controlled by medicine.  °There is increasing  pain or tenderness near or in the surgical site.  ° ° °

## 2015-03-23 NOTE — ED Notes (Signed)
Patient transported to Ultrasound 

## 2015-03-23 NOTE — Anesthesia Procedure Notes (Signed)
Procedure Name: Intubation Date/Time: 03/23/2015 3:40 PM Performed by: Michaele Offer Pre-anesthesia Checklist: Patient identified, Emergency Drugs available, Suction available, Patient being monitored and Timeout performed Patient Re-evaluated:Patient Re-evaluated prior to inductionOxygen Delivery Method: Circle system utilized Preoxygenation: Pre-oxygenation with 100% oxygen Intubation Type: IV induction Ventilation: Mask ventilation without difficulty Laryngoscope Size: Mac and 3 Grade View: Grade I Tube type: Oral Tube size: 7.0 mm Number of attempts: 1 Airway Equipment and Method: Rigid stylet Placement Confirmation: ETT inserted through vocal cords under direct vision,  positive ETCO2 and breath sounds checked- equal and bilateral Secured at: 21 cm Tube secured with: Tape Dental Injury: Teeth and Oropharynx as per pre-operative assessment

## 2015-03-23 NOTE — ED Provider Notes (Addendum)
[facility] Emergency Department Provider Note  Time seen: 8:57 AM  I have reviewed the triage vital signs and the nursing notes.   HISTORY  Chief Complaint Abdominal Pain    HPI Dana Daugherty is a 29 y.o. female with a past medical history of thrombocytopenia presents to the emergency department with lower abdominal pain. According to the patient shortly after having sexual intercourse last night she developed acute 10/10 lower abdominal pain. She states the pain has been persistent overnight spreading to her entire abdomen. Feels nauseated but denies vomiting or diarrhea. Denies vaginal bleeding or discharge. In monogamous relationship, husband has undergone a vasectomy. Currently describes the pain as a 10/10 sharp lower abdominal pain that radiates to her entire abdomen. Much worse with any type of movement.     Past Medical History  Diagnosis Date  . No pertinent past medical history   . Medical history non-contributory   . Thrombocytopenia     with last pregnancy    There are no active problems to display for this patient.   Past Surgical History  Procedure Laterality Date  . No past surgeries      Current Outpatient Rx  Name  Route  Sig  Dispense  Refill  . ibuprofen (ADVIL,MOTRIN) 600 MG tablet   Oral   Take 1 tablet (600 mg total) by mouth every 6 (six) hours.   30 tablet   0   . oxyCODONE-acetaminophen (PERCOCET/ROXICET) 5-325 MG per tablet   Oral   Take 1-2 tablets by mouth every 4 (four) hours as needed.   30 tablet   0   . Prenatal Vit-Fe Fumarate-FA (PRENATAL MULTIVITAMIN) TABS   Oral   Take 1 tablet by mouth daily at 12 noon.           Allergies Review of patient's allergies indicates no known allergies.  No family history on file.  Social History Social History  Substance Use Topics  . Smoking status: Former Games developer  . Smokeless tobacco: Not on file  . Alcohol Use: Yes     Comment: occasional wine    Review  of Systems Constitutional: Negative for fever. Cardiovascular: Negative for chest pain. Respiratory: Negative for shortness of breath. Gastrointestinal: Positive for lower abdominal pain and nausea. Negative for vomiting or diarrhea. Genitourinary: Negative for dysuria. Negative for hematuria. Negative for vaginal bleeding or discharge. Musculoskeletal: Negative for back pain Neurological: Negative for headache 10-point ROS otherwise negative.  ____________________________________________   PHYSICAL EXAM:  VITAL SIGNS: ED Triage Vitals  Enc Vitals Group     BP 03/23/15 0753 111/84 mmHg     Pulse Rate 03/23/15 0753 111     Resp 03/23/15 0753 18     Temp 03/23/15 0753 98.3 F (36.8 C)     Temp Source 03/23/15 0753 Oral     SpO2 03/23/15 0753 97 %     Weight --      Height 03/23/15 0753  (1.676 m)     Head Cir --      Peak Flow --      Pain Score 03/23/15 0755 10     Pain Loc --      Pain Edu? --      Excl. in GC? --     Constitutional: Alert and oriented. Well appearing, but in mild distress due to pain. Eyes: Normal exam ENT   Mouth/Throat: Mucous membranes are moist. Cardiovascular: Normal rate, regular rhythm. No murmur Respiratory: Normal respiratory effort without tachypnea  nor retractions. Breath sounds are clear  Gastrointestinal: Soft, severe tenderness to palpation in the lower abdomen, no guarding or rebound. Moderate tenderness to palpation diffusely in the abdomen again no guarding or rebound. No distention. Musculoskeletal: Nontender with normal range of motion in all extremities.  Neurologic:  Normal speech and language. No gross focal neurologic deficits are appreciated. Speech is normal. Skin:  Skin is warm, dry and intact.  Psychiatric: Mood and affect are normal. Speech and behavior are normal.  ____________________________________________      RADIOLOGY  Ultrasound consistent with large amount of free fluid. Cannot obtain transvaginal due  to patient discomfort.  ____________________________________________   INITIAL IMPRESSION / ASSESSMENT AND PLAN / ED COURSE  Pertinent labs & imaging results that were available during my care of the patient were reviewed by me and considered in my medical decision making (see chart for details).  Patient with acute onset of lower abdominal pain after sexual intercourse last night, now this has spread to her entire abdomen. Patient does have significant tenderness on exam, mild voluntary guarding, no rebound. At this time the differential is quite wide, but would include ruptured hemorrhagic cyst/ovarian cysts, ectopic pregnancy, appendicitis, pyelonephritis. We will obtain labs, treat pain and nausea, and obtain an ultrasound to help further evaluate.  Ultrasound consistent with a lot of free fluid. I discussed the patient with Dr. Valentino Saxon of OB/GYN. She recommends pain control.  We will monitor the patient in the emergency department continue with pain control. Given the amount of free fluid visualized on ultrasound I do not believe the patient would be an ideal candidate for outpatient follow-up, and will likely admit.  Continues with significant abdominal pain. Discussed with Dr. Valentino Saxon again, Currently awaiting OB/GYN consultation.  ----------------------------------------- 2:03 PM on 03/23/2015 -----------------------------------------  Dr. Valentino Saxon is seen the patient, plans to take to the operating room.  ____________________________________________   FINAL CLINICAL IMPRESSION(S) / ED DIAGNOSES  Abdominal pain Hemoperitoneum   Minna Antis, MD 03/23/15 1247  Minna Antis, MD 03/23/15 1247  Minna Antis, MD 03/23/15 [phone]

## 2015-03-23 NOTE — H&P (Addendum)
Gynecology History and Physical   Subjective: Patient is a 29 y.o. gravida 2 para 2, female with a past medical history of gestational thrombocytopenia.  I was consulted for evaluation of acute pelvic pain, not relieved with IV pain medications. Onset of symptoms was gradual starting 1 day ago with rapidly worsening course since that time. The pain initiated after intercourse yesterday.  It is located in the lower pelvis and vaginal area and is persistent.  She describes the pain as aching. Symptoms not improved with anything, aggravated by lying recumbent (notes that she develops SOB).  Patient has received 3 doses of Morphine in the ER with minimal relief of symptoms.   Pertinent Gyn History: Menses: regular every 30 days without intermenstrual spotting Contraception: vasectomy Blood transfusions: none STDs: no past history Preventive screening:   OB History  Gravida Para Term Preterm AB SAB TAB Ectopic Multiple Living  # Outcome Date GA Lbr Len/2nd Weight Sex Delivery Anes PTL Lv  2 Term 09/19/12 [redacted]w[redacted]d 07:18 / 00:14 8 lb 1.8 oz (3.68 kg) F Vag-Spont Other  Y  1 Term               Past Medical History  Diagnosis Date  . No pertinent past medical history   . Medical history non-contributory   . Thrombocytopenia     with last pregnancy    Past Surgical History  Procedure Laterality Date  . No past surgeries       Social History  Substance Use Topics  . Smoking status: Former Games developer  . Smokeless tobacco: Not on file  . Alcohol Use: Yes     Comment: occasional wine     History reviewed. No pertinent family history.    Current Outpatient Prescriptions on File Prior to Encounter  Medication Sig Dispense Refill  . ibuprofen (ADVIL,MOTRIN) 600 MG tablet Take 1 tablet (600 mg total) by mouth every 6 (six) hours. 30 tablet 0    No Known Allergies   Review of Systems Constitutional: negative for chills, fatigue, fevers and sweats Eyes: negative for  irritation, redness and visual disturbance Ears, nose, mouth, throat, and face: negative for hearing loss, nasal congestion, snoring and tinnitus Respiratory: negative for asthma, cough, sputum Cardiovascular: negative for chest pain, dyspnea, exertional chest pressure/discomfort, irregular heart beat, palpitations and syncope Gastrointestinal: positive for abdominal pain, negative change in bowel habits, nausea and vomiting Genitourinary: negative for abnormal menstrual periods, genital lesions, sexual problems and vaginal discharge, dysuria and urinary incontinence Integument/breast: negative for breast lump, breast tenderness and nipple discharge Hematologic/lymphatic: positive for easy bruising, denies spontaneous bleeding Musculoskeletal:negative for back pain and muscle weakness Neurological: negative for dizziness, headaches, vertigo and weakness Endocrine: negative for diabetic symptoms including polydipsia, polyuria and skin dryness Allergic/Immunologic: negative for hay fever and urticaria     Objective: Vital signs in last 24 hours: Temp:  [98.3 F (36.8 C)] 98.3 F (36.8 C) (09/28 0753) Pulse Rate:  [64-111] 85 (09/28 1300) Resp:  [18] 18 (09/28 0753) BP: (101-120)/(62-85) 101/70 mmHg (09/28 1300) SpO2:  [97 %-100 %] 98 % (09/28 1300)   General Appearance:    Alert, cooperative, no distress, appears stated age  Head:    Normocephalic, without obvious abnormality, atraumatic  Eyes:    PERRL, conjunctiva/corneas clear, EOM's intact, both eyes  Ears:    Normal external ear canals, both ears  Nose:   Nares normal, septum midline, mucosa normal, no drainage  or sinus tenderness  Throat:   Lips, mucosa, and tongue normal; teeth and gums normal  Neck:   Supple, symmetrical, trachea midline, no adenopathy; thyroid: no enlargement/tenderness/nodules; no carotid bruit or JVD  Back:     Symmetric, no curvature, ROM normal, no CVA tenderness  Lungs:     Clear to auscultation  bilaterally, respirations unlabored  Chest Wall:    No tenderness or deformity   Heart:    Regular rate and rhythm, S1 and S2 normal, no murmur, rub or gallop  Breast Exam:    No tenderness, masses, or nipple abnormality  Abdomen:     Soft, moderately tender with guarding present. Non-distended, bowel sounds active all four quadrants, no masses, no organomegaly.    Genitalia:    Pelvic:external genitalia normal, vagina without lesions, discharge, or tenderness, rectovaginal septum  normal. Cervix without cervical motion tenderness, no adnexal masses, but bilateral tenderness and fullness present.  Declined speculum exam due to pain.    Rectal:    Normal external sphincter.  No hemorrhoids appreciated. Internal exam not done.   Extremities:   Extremities normal, atraumatic, no cyanosis or edema  Pulses:   2+ and symmetric all extremities  Skin:   Skin color, texture, turgor normal, no rashes or lesions  Lymph nodes:   Cervical, supraclavicular, and axillary nodes normal  Neurologic:   CNII-XII intact, normal strength, sensation and reflexes throughout    Data Review:  Results for orders placed or performed during the hospital encounter of 03/23/15  Comprehensive metabolic panel  Result Value Ref Range   Sodium 139 135 - 145 mmol/L   Potassium 3.9 3.5 - 5.1 mmol/L   Chloride 107 101 - 111 mmol/L   CO2 22 22 - 32 mmol/L   Glucose, Bld 114 (H) 65 - 99 mg/dL   BUN 15 6 - 20 mg/dL   Creatinine, Ser 8.65 0.44 - 1.00 mg/dL   Calcium 9.0 8.9 - 78.4 mg/dL   Total Protein 7.3 6.5 - 8.1 g/dL   Albumin 4.3 3.5 - 5.0 g/dL   AST 26 15 - 41 U/L   ALT 6 (L) 14 - 54 U/L   Alkaline Phosphatase 59 38 - 126 U/L   Total Bilirubin 0.7 0.3 - 1.2 mg/dL   GFR calc non Af Amer >60 >60 mL/min   GFR calc Af Amer >60 >60 mL/min   Anion gap 10 5 - 15  CBC with Differential  Result Value Ref Range   WBC 15.6 (H) 3.6 - 11.0 K/uL   RBC 3.98 3.80 - 5.20 MIL/uL   Hemoglobin 11.2 (L) 12.0 - 16.0 g/dL   HCT 69.6  (L) 29.5 - 47.0 %   MCV 86.8 80.0 - 100.0 fL   MCH 28.2 26.0 - 34.0 pg   MCHC 32.5 32.0 - 36.0 g/dL   RDW 28.4 13.2 - 44.0 %   Platelets 98 (L) 150 - 440 K/uL   Neutrophils Relative % 79% %   Neutro Abs 12.2 (H) 1.4 - 6.5 K/uL   Lymphocytes Relative 11% %   Lymphs Abs 1.8 1.0 - 3.6 K/uL   Monocytes Relative 9% %   Monocytes Absolute 1.5 (H) 0.2 - 0.9 K/uL   Eosinophils Relative 0% %   Eosinophils Absolute 0.1 0 - 0.7 K/uL   Basophils Relative 0% %   Basophils Absolute 0.0 0 - 0.1 K/uL  Urinalysis complete, with microscopic  Result Value Ref Range   Color, Urine YELLOW (A) YELLOW   APPearance CLEAR (A)  CLEAR   Glucose, UA NEGATIVE NEGATIVE mg/dL   Bilirubin Urine NEGATIVE NEGATIVE   Ketones, ur NEGATIVE NEGATIVE mg/dL   Specific Gravity, Urine 1.026 1.005 - 1.030   Hgb urine dipstick NEGATIVE NEGATIVE   pH 5.0 5.0 - 8.0   Protein, ur NEGATIVE NEGATIVE mg/dL   Nitrite NEGATIVE NEGATIVE   Leukocytes, UA NEGATIVE NEGATIVE   RBC / HPF 0-5 0 - 5 RBC/hpf   WBC, UA 0-5 0 - 5 WBC/hpf   Bacteria, UA RARE (A) NONE SEEN   Squamous Epithelial / LPF 0-5 (A) NONE SEEN   Mucous PRESENT   hCG, quantitative, pregnancy  Result Value Ref Range   hCG, Beta Chain, Quant, S 1 <5 mIU/mL  Pregnancy, urine POC  Result Value Ref Range   Preg Test, Ur NEGATIVE NEGATIVE  Type and screen  Result Value Ref Range   ABO/RH(D) A POS    Antibody Screen NEG    Sample Expiration 03/26/2015     Imaging:  Pelvic Ultrasound 03/23/2015:  FINDINGS: Due to pain, the patient declined transvaginal examination.  Uterus Measurements: 7.8 x 4.6 x 5.4 cm. No fibroids or other mass visualized. Endometrium Thickness: 15 mm. No focal abnormality visualized.  Right ovary Measurements: 4.2 x 5.6 x 3.2 cm. No focal abnormality.  Left ovary Not identified.  Other findings: Large amount of free fluid throughout the pelvis as well as in the abdomen extending to the liver. Some echogenic debris is present  within the fluid.  IMPRESSION: 1. Enlarged right ovary without focal abnormality. Left ovary not visualized. 2. Large amount of free fluid in the abdomen and pelvis. CT abdomen hospitalization and antibiotic therapy; injury to bowel, bladder, ureters and major vessels or other surrounding organs; need for additional procedures including laparotomy; thromboembolic phenomenon, incisional problems and other postoperative or anesthesia complications.  Patient was told that the likelihood that her condition and symptoms will be treated effectively with this surgical management was very high; the postoperative expectations were also discussed in detail. The patient also understands the alternative treatment options which were discussed in full. All questions were answered.  Patient last ate at 9 pm.  Can proceed to OR ASAP.  2) Thrombocytopenia - patient notes h/o thrombocytopenia only in pregnancy, however notes levels got as low as 50K, however would return to nromal levels postpartum.  Discussed that with current levels I would be concerned about a platelet disorder, such as ITP.  Would recommend referral to Hematologist as outpatient.    Hildred Laser, MD [facility]

## 2015-03-23 NOTE — ED Notes (Signed)
Pt arrived via POV from home with family. Pt states abd pain started last night but has occurred before. Last night had sex and has severe pain today. Describes as sharp shooting abd pain that radiates to collar bone.  Has taken ibuprofen last night without relief. Rates pain 10/10; tearful during interview.

## 2015-03-24 ENCOUNTER — Encounter: Payer: Self-pay | Admitting: Obstetrics and Gynecology

## 2015-03-26 ENCOUNTER — Encounter: Payer: Self-pay | Admitting: Emergency Medicine

## 2015-03-26 ENCOUNTER — Emergency Department
Admission: EM | Admit: 2015-03-26 | Discharge: 2015-03-26 | Disposition: A | Discharge status: Home / Self Care | Payer: 59 | Attending: Emergency Medicine | Admitting: Emergency Medicine

## 2015-03-26 DIAGNOSIS — Z79899 Other long term (current) drug therapy: Secondary | ICD-10-CM | POA: Insufficient documentation

## 2015-03-26 DIAGNOSIS — R109 Unspecified abdominal pain: Secondary | ICD-10-CM | POA: Diagnosis not present

## 2015-03-26 DIAGNOSIS — R55 Syncope and collapse: Secondary | ICD-10-CM | POA: Insufficient documentation

## 2015-03-26 DIAGNOSIS — G8918 Other acute postprocedural pain: Secondary | ICD-10-CM | POA: Insufficient documentation

## 2015-03-26 DIAGNOSIS — R531 Weakness: Secondary | ICD-10-CM

## 2015-03-26 DIAGNOSIS — R42 Dizziness and giddiness: Secondary | ICD-10-CM | POA: Diagnosis not present

## 2015-03-26 DIAGNOSIS — R200 Anesthesia of skin: Secondary | ICD-10-CM | POA: Insufficient documentation

## 2015-03-26 DIAGNOSIS — Z720 Tobacco use: Secondary | ICD-10-CM | POA: Diagnosis not present

## 2015-03-26 LAB — URINALYSIS COMPLETE WITH MICROSCOPIC (ARMC ONLY)
BILIRUBIN URINE: NEGATIVE
GLUCOSE, UA: NEGATIVE mg/dL
Ketones, ur: NEGATIVE mg/dL
Leukocytes, UA: NEGATIVE
NITRITE: NEGATIVE
Protein, ur: NEGATIVE mg/dL
Specific Gravity, Urine: 1.005 (ref 1.005–1.030)
pH: 7 (ref 5.0–8.0)

## 2015-03-26 LAB — COMPREHENSIVE METABOLIC PANEL
ALBUMIN: 4 g/dL (ref 3.5–5.0)
ALK PHOS: 44 U/L (ref 38–126)
ALT: 7 U/L — ABNORMAL LOW (ref 14–54)
ANION GAP: 7 (ref 5–15)
AST: 17 U/L (ref 15–41)
BILIRUBIN TOTAL: 0.5 mg/dL (ref 0.3–1.2)
BUN: 9 mg/dL (ref 6–20)
CALCIUM: 9.4 mg/dL (ref 8.9–10.3)
CO2: 27 mmol/L (ref 22–32)
Chloride: 104 mmol/L (ref 101–111)
Creatinine, Ser: 0.73 mg/dL (ref 0.44–1.00)
GFR calc Af Amer: >60 mL/min (ref >60)
GFR calc non Af Amer: >60 mL/min (ref >60)
GLUCOSE: 104 mg/dL — AB (ref 65–99)
Potassium: 3.4 mmol/L — ABNORMAL LOW (ref 3.5–5.1)
SODIUM: 138 mmol/L (ref 135–145)
Total Protein: 7.1 g/dL (ref 6.5–8.1)

## 2015-03-26 LAB — CBC
HEMATOCRIT: 29.6 % — AB (ref 35.0–47.0)
HEMOGLOBIN: 9.7 g/dL — AB (ref 12.0–16.0)
MCH: 28.8 pg (ref 26.0–34.0)
MCHC: 33 g/dL (ref 32.0–36.0)
MCV: 87.5 fL (ref 80.0–100.0)
Platelets: 117 10*3/uL — ABNORMAL LOW (ref 150–440)
RBC: 3.38 MIL/uL — ABNORMAL LOW (ref 3.80–5.20)
RDW: 13.9 % (ref 11.5–14.5)
WBC: 7.1 10*3/uL (ref 3.6–11.0)

## 2015-03-26 NOTE — ED Notes (Signed)
States feels weaker today than yesterday, pale

## 2015-03-26 NOTE — ED Provider Notes (Signed)
[facility] Emergency Department Provider Note  ____________________________________________  Time seen: Approximately 4:20 PM  I have reviewed the triage vital signs and the nursing notes.   HISTORY  Chief Complaint Weakness    HPI Dana Daugherty is a 29 y.o. female with a recent laparoscopic procedure October 28 for a ruptured ovarian cyst is presenting today with worsening weakness. She says that she has had generalized weakness today with feelings of lightheadedness and as if she would pass out when she stands up. When her weakness gets worse she also has some tongue numbness. She says that her belly pain has been improving and that she moved her bowels today. She does have some diffuse abdominal soreness after the procedure. Denies any nausea or vomiting. Last ate breakfast this morning and has not eaten anything since. He was found at 1300 cc of blood in her abdomen at the time of the procedure.   Past Medical History  Diagnosis Date  . No pertinent past medical history   . Medical history non-contributory   . Thrombocytopenia (HCC)     with last pregnancy    There are no active problems to display for this patient.   Past Surgical History  Procedure Laterality Date  . No past surgeries    . Laparoscopy N/A 03/23/2015    Procedure: LAPAROSCOPY DIAGNOSTIC;  Surgeon: Hildred Laser, MD;  Location: [facility] ORS;  Service: Gynecology;  Laterality: N/A;  . Ruptiured ovarian cyst      Current Outpatient Rx  Name  Route  Sig  Dispense  Refill  . docusate sodium (COLACE) 100 MG capsule   Oral   Take 1 capsule (100 mg total) by mouth 2 (two) times daily as needed.   30 capsule   2   . ferrous sulfate (FERROUSUL) 325 (65 FE) MG tablet   Oral   Take 1 tablet (325 mg total) by mouth 2 (two) times daily.   60 tablet   1   . HYDROcodone-acetaminophen (NORCO/VICODIN) 5-325 MG tablet   Oral   Take 1-2 tablets by mouth every 6 (six) hours as needed.  30 tablet   0   . ibuprofen (ADVIL,MOTRIN) 800 MG tablet   Oral   Take 1 tablet (800 mg total) by mouth every 8 (eight) hours as needed.   60 tablet   1     Allergies Review of patient's allergies indicates no known allergies.  No family history on file.  Social History Social History  Substance Use Topics  . Smoking status: Current Every Day Smoker -- 0.50 packs/day    Types: Cigarettes  . Smokeless tobacco: None  . Alcohol Use: No     Comment: occasional wine    Review of Systems Constitutional: No fever/chills Eyes: No visual changes. ENT: No sore throat. Cardiovascular: Denies chest pain. Respiratory: Denies shortness of breath. Gastrointestinal: Improved abdominal pain diffusely.  No nausea, no vomiting.  No diarrhea.  No constipation. Genitourinary: Negative for dysuria. Musculoskeletal: Negative for back pain. Skin: Negative for rash. Neurological: Negative for headaches, focal weakness or numbness.  10-point ROS otherwise negative.  ____________________________________________   PHYSICAL EXAM:  VITAL SIGNS: ED Triage Vitals  Enc Vitals Group     BP 03/26/15 1305 113/78 mmHg     Pulse Rate 03/26/15 1305 82     Resp 03/26/15 1305 18     Temp 03/26/15 1305 98 F (36.7 C)     Temp Source 03/26/15 1305 Oral     SpO2 03/26/15  1305 100 %     Weight 03/26/15 1305 140 lb (63.504 kg)     Height 03/26/15 1305  (1.702 m)     Head Cir --      Peak Flow --      Pain Score 03/26/15 1306 6     Pain Loc --      Pain Edu? --      Excl. in GC? --     Constitutional: Alert and oriented. Well appearing and in no acute distress. Eyes: Conjunctivae are normal. PERRL. EOMI. Head: Atraumatic. Nose: No congestion/rhinnorhea. Mouth/Throat: Mucous membranes are moist.  Oropharynx non-erythematous. Neck: No stridor.   Cardiovascular: Normal rate, regular rhythm. Grossly normal heart sounds.  Good peripheral circulation. Respiratory: Normal respiratory effort.   No retractions. Lungs CTAB. Gastrointestinal: Soft with mild tenderness throughout. There is no rebound or guarding. The incision sites are clean dry and intact. There is no surrounding erythema, induration or pus.. No distention. No abdominal bruits. No CVA tenderness. Musculoskeletal: No lower extremity tenderness nor edema.  No joint effusions. Neurologic:  Normal speech and language. No gross focal neurologic deficits are appreciated. No gait instability. Skin:  Skin is warm, dry and intact. No rash noted. Psychiatric: Mood and affect are normal. Speech and behavior are normal.  ____________________________________________   LABS (all labs ordered are listed, but only abnormal results are displayed)  Labs Reviewed  CBC - Abnormal; Notable for the following:    RBC 3.38 (*)    Hemoglobin 9.7 (*)    HCT 29.6 (*)    Platelets 117 (*)    All other components within normal limits  URINALYSIS COMPLETEWITH MICROSCOPIC ([facility] ONLY) - Abnormal; Notable for the following:    Color, Urine YELLOW (*)    APPearance CLEAR (*)    Hgb urine dipstick 1+ (*)    Bacteria, UA RARE (*)    Squamous Epithelial / LPF 0-5 (*)    All other components within normal limits  COMPREHENSIVE METABOLIC PANEL - Abnormal; Notable for the following:    Potassium 3.4 (*)    Glucose, Bld 104 (*)    ALT 7 (*)    All other components within normal limits   ____________________________________________  EKG  ED ECG REPORT I, Arelia Longest, the attending physician, personally viewed and interpreted this ECG.   Date: 03/26/2015  EKG Time: 1637  Rate: 67  Rhythm: normal sinus rhythm with sinus arrhythmia  Axis: Normal axis  Intervals:none  ST&T Change: No ST elevations or depressions. No abnormal T-wave inversions.  ____________________________________________  RADIOLOGY   ____________________________________________   PROCEDURES   ____________________________________________   INITIAL  IMPRESSION / ASSESSMENT AND PLAN / ED COURSE  Pertinent labs & imaging results that were available during my care of the patient were reviewed by me and considered in my medical decision making (see chart for details).  Patient was stable hemoglobin from the 28th when she had her surgery performed at 9.7. Also with platelets increased now to 117. I believe the patient's weakness is part of her recovery from her recent illness. The biggest concern would be that she will would've become acutely dehydrated or she would've continued to have bleeding in her abdomen causing worsening anemia. However, she is not showing signs of either of these conditions on her lab work. We will check an EKG because of her near syncopal episodes. However, I believe them to be likely vasovagal in origin.  ----------------------------------------- 5:06 PM on 03/26/2015 -----------------------------------------  Patient resting  comfortably and eating at this time. Heart rate is 78 while sitting and blood pressure systolic of 110. Upon standing her heart rate is 82 with a systolic blood pressure 117. Mild lightheadedness when standing. Reviewed the lab results as well as the EKG results with the patient as well as her mother. Feel the patient is likely weak secondary to her recent medical illness and hospitalization and surgery. I advised her to rise slowly from a lying or sitting position to standing to avoid passing out. Stressed stay hydrated at home. We'll discharge to home. Has follow-up with Dr. Oretha Milch office she says that they will contact her. She is also off of work through this coming Wednesday. ____________________________________________   FINAL CLINICAL IMPRESSION(S) / ED DIAGNOSES  Acute weakness with near syncope. Initial visit.    Myrna Blazer, MD 03/26/15 [phone]

## 2015-03-26 NOTE — Discharge Instructions (Signed)
Near-Syncope °Near-syncope (commonly known as near fainting) is sudden weakness, dizziness, or feeling like you might pass out. This can happen when getting up or while standing for a long time. It is caused by a sudden decrease in blood flow to the brain, which can occur for various reasons. Most of the reasons are not serious.  °HOME CARE °Watch your condition for any changes. °· Have someone stay with you until you feel stable. °· If you feel like you are going to pass out: °¨ Lie down right away. °¨ Prop your feet up if you can. °¨ Breathe deeply and steadily. °¨ Move only when the feeling has gone away. Most of the time, this feeling lasts only a few minutes. You may feel tired for several hours. °· Drink enough fluids to keep your pee (urine) clear or pale yellow. °· If you are taking blood pressure or heart medicine, stand up slowly. °· Follow up with your doctor as told. °GET HELP RIGHT AWAY IF:  °· You have a severe headache. °· You have unusual pain in the chest, belly (abdomen), or back. °· You have bleeding from the mouth or butt (rectum), or you have black or tarry poop (stool). °· You feel your heart beat differently than normal, or you have a very fast pulse. °· You pass out, or you twitch and shake when you pass out. °· You pass out when sitting or lying down. °· You feel confused. °· You have trouble walking. °· You are weak. °· You have vision problems. °MAKE SURE YOU:  °· Understand these instructions. °· Will watch your condition. °· Will get help right away if you are not doing well or get worse. °Document Released: 11/28/2007 Document Revised: 06/16/2013 Document Reviewed: 11/14/2012 °[employer]® Patient Information ©2015 [employer]. This information is not intended to replace advice given to you by your health care provider. Make sure you discuss any questions you have with your health care provider. ° °

## 2015-06-10 ENCOUNTER — Ambulatory Visit
Admission: RE | Admit: 2015-06-10 | Discharge: 2015-06-10 | Disposition: A | Discharge status: Home / Self Care | Payer: 59 | Source: Ambulatory Visit | Attending: Nurse Practitioner | Admitting: Nurse Practitioner

## 2015-06-10 ENCOUNTER — Telehealth: Payer: Self-pay | Admitting: *Deleted

## 2015-06-10 ENCOUNTER — Encounter (INDEPENDENT_AMBULATORY_CARE_PROVIDER_SITE_OTHER): Payer: Self-pay

## 2015-06-10 ENCOUNTER — Ambulatory Visit (INDEPENDENT_AMBULATORY_CARE_PROVIDER_SITE_OTHER): Payer: 59 | Admitting: Nurse Practitioner

## 2015-06-10 ENCOUNTER — Encounter: Payer: Self-pay | Admitting: Nurse Practitioner

## 2015-06-10 VITALS — BP 108/70 | HR 88 | Temp 98.4°F | Ht 67.0 in | Wt 134.4 lb

## 2015-06-10 DIAGNOSIS — F17200 Nicotine dependence, unspecified, uncomplicated: Secondary | ICD-10-CM | POA: Insufficient documentation

## 2015-06-10 DIAGNOSIS — Z7189 Other specified counseling: Secondary | ICD-10-CM | POA: Diagnosis not present

## 2015-06-10 DIAGNOSIS — Z7689 Persons encountering health services in other specified circumstances: Secondary | ICD-10-CM | POA: Insufficient documentation

## 2015-06-10 DIAGNOSIS — M791 Myalgia, unspecified site: Secondary | ICD-10-CM

## 2015-06-10 DIAGNOSIS — R05 Cough: Secondary | ICD-10-CM | POA: Diagnosis not present

## 2015-06-10 MED ORDER — CYCLOBENZAPRINE HCL 5 MG PO TABS: 5.0000 mg | ORAL_TABLET | Freq: Every day | ORAL | Status: DC

## 2015-06-10 NOTE — Telephone Encounter (Signed)
Patient was requested Xray results from today, if they are available .Please advise

## 2015-06-10 NOTE — Patient Instructions (Signed)
[facility] [address] [place], [place] [address] Hours of Operation Monday - Friday, 8 a.m. - 5 p.m. Main: [phone]   Flexeril- don't drive until you know how this will affect you. Ibuprofen and heat are a good idea.

## 2015-06-10 NOTE — Assessment & Plan Note (Addendum)
Flexeril and Chest X-ray today. Pt works at 2 pm. I asked her to try the flexeril later after work, before bed. She is agreeable to this. Will follow after results

## 2015-06-10 NOTE — Assessment & Plan Note (Signed)
Discussed acute and chronic issues. Reviewed health maintenance measures, PFSHx, and immunizations. Obtain records from previous facility.   Tdap deferred until next visit

## 2015-06-10 NOTE — Telephone Encounter (Signed)
LM of normal xray.

## 2015-06-10 NOTE — Progress Notes (Signed)
Patient ID: Dana Daugherty, female    DOB: 05-Apr-1986  Age: 29 y.o. MRN: [id number]  CC: Establish Care   HPI Dana Daugherty presents for Establishing care and CC of left shoulder blade pain.   1) New pt info:   Immunizations- Flu- Declines, Tdap- Today   Pap- Due she reports, needs OB.GYN in [place]   Eye Exam- Due for annual   LMP-  06/05/15, lasts 7 days approx, normal for pt   Tobacco use- weaning   2) Chronic Problems-  Ovarian cysts- recent ER visit for rupture  3) Acute Problems-  Left shoulder blade pain for coughing   Took Ibuprofen last night  Working Sunday it was irritating her like a pulled muscle, stretching, progressed to pain with coughing, most painful to sleep on left side and with coughing.   Minute clinic- Amoxicillin and prednisone last week   History Dana Daugherty has a past medical history of No pertinent past medical history; Medical history non-contributory; and Thrombocytopenia (HCC).   She has past surgical history that includes No past surgeries; laparoscopy (N/A, 03/23/2015); and ruptiured ovarian cyst.   Her family history includes Alzheimer's disease in her paternal grandfather; Diabetes in her paternal grandfather.She reports that she has been smoking Cigarettes.  She has been smoking about 0.50 packs per day. She does not have any smokeless tobacco history on file. She reports that she does not drink alcohol or use illicit drugs.  Outpatient Prescriptions Prior to Visit  Medication Sig Dispense Refill  . docusate sodium (COLACE) 100 MG capsule Take 1 capsule (100 mg total) by mouth 2 (two) times daily as needed. (Patient not taking: Reported on 06/10/2015) 30 capsule 2  . ferrous sulfate (FERROUSUL) 325 (65 FE) MG tablet Take 1 tablet (325 mg total) by mouth 2 (two) times daily. (Patient not taking: Reported on 06/10/2015) 60 tablet 1  . HYDROcodone-acetaminophen (NORCO/VICODIN) 5-325 MG tablet Take 1-2 tablets by mouth every 6 (six) hours as  needed. (Patient not taking: Reported on 06/10/2015) 30 tablet 0  . ibuprofen (ADVIL,MOTRIN) 800 MG tablet Take 1 tablet (800 mg total) by mouth every 8 (eight) hours as needed. (Patient not taking: Reported on 06/10/2015) 60 tablet 1   No facility-administered medications prior to visit.    ROS Review of Systems  Constitutional: Negative for fever, chills, diaphoresis and fatigue.  Respiratory: Positive for cough. Negative for chest tightness, shortness of breath and wheezing.   Cardiovascular: Negative for chest pain, palpitations and leg swelling.  Gastrointestinal: Negative for nausea, vomiting and diarrhea.  Musculoskeletal: Positive for myalgias. Negative for back pain, arthralgias and neck pain.  Skin: Negative for rash.  Neurological: Negative for dizziness, weakness, numbness and headaches.  Psychiatric/Behavioral: Positive for sleep disturbance. The patient is not nervous/anxious.     Objective:  BP 108/70 mmHg  Pulse 88  Temp(Src) 98.4 F (36.9 C) (Oral)  Ht 5\' 7"  (1.702 m)  Wt 134 lb 6.4 oz (60.963 kg)  BMI 21.04 kg/m2  SpO2 99%  LMP 06/04/2015  Physical Exam  Constitutional: She is oriented to person, place, and time. She appears well-developed and well-nourished. No distress.  HENT:  Head: Normocephalic and atraumatic.  Right Ear: External ear normal.  Left Ear: External ear normal.  Cardiovascular: Normal rate, regular rhythm and normal heart sounds.  Exam reveals no gallop and no friction rub.   No murmur heard. Pulmonary/Chest: Effort normal and breath sounds normal. No respiratory distress. She has no wheezes. She has no rales. She exhibits no  tenderness.  Musculoskeletal: Normal range of motion. She exhibits tenderness.       Back:  Tender area, pain with bicep pulling against resistance on left 5/5 bilaterally still, deltoid 5/5 bilaterally without pain, pain with arm abduction and extension No visible deformity  Neurological: She is alert and oriented  to person, place, and time. No cranial nerve deficit. She exhibits normal muscle tone. Coordination normal.  Skin: Skin is warm and dry. No rash noted. She is not diaphoretic.  Psychiatric: She has a normal mood and affect. Her behavior is normal. Judgment and thought content normal.      Assessment & Plan:   Dana Daugherty was seen today for establish care.  Diagnoses and all orders for this visit:  Encounter to establish care  Myalgia -     DG Chest 2 View; Future  Other orders -     cyclobenzaprine (FLEXERIL) 5 MG tablet; Take 1 tablet (5 mg total) by mouth at bedtime.   I have discontinued Ms. Czaja's HYDROcodone-acetaminophen, ferrous sulfate, docusate sodium, and ibuprofen. I am also having her start on cyclobenzaprine.  Meds ordered this encounter  Medications  . cyclobenzaprine (FLEXERIL) 5 MG tablet    Sig: Take 1 tablet (5 mg total) by mouth at bedtime.    Dispense:  30 tablet    Refill:  0    Order Specific Question:  Supervising Provider    Answer:  Dana Daugherty [[id number]]     Follow-up: Return if symptoms worsen or fail to improve.

## 2015-08-27 ENCOUNTER — Emergency Department
Admission: EM | Admit: 2015-08-27 | Discharge: 2015-08-27 | Disposition: A | Discharge status: Home / Self Care | Payer: 59 | Attending: Emergency Medicine | Admitting: Emergency Medicine

## 2015-08-27 ENCOUNTER — Encounter: Payer: Self-pay | Admitting: Emergency Medicine

## 2015-08-27 DIAGNOSIS — Y9389 Activity, other specified: Secondary | ICD-10-CM | POA: Diagnosis not present

## 2015-08-27 DIAGNOSIS — Y281XXA Contact with knife, undetermined intent, initial encounter: Secondary | ICD-10-CM | POA: Diagnosis not present

## 2015-08-27 DIAGNOSIS — S6992XA Unspecified injury of left wrist, hand and finger(s), initial encounter: Secondary | ICD-10-CM | POA: Diagnosis present

## 2015-08-27 DIAGNOSIS — Y9289 Other specified places as the place of occurrence of the external cause: Secondary | ICD-10-CM | POA: Diagnosis not present

## 2015-08-27 DIAGNOSIS — Y990 Civilian activity done for income or pay: Secondary | ICD-10-CM | POA: Diagnosis not present

## 2015-08-27 DIAGNOSIS — F17210 Nicotine dependence, cigarettes, uncomplicated: Secondary | ICD-10-CM | POA: Insufficient documentation

## 2015-08-27 DIAGNOSIS — Z79899 Other long term (current) drug therapy: Secondary | ICD-10-CM | POA: Diagnosis not present

## 2015-08-27 DIAGNOSIS — S61211A Laceration without foreign body of left index finger without damage to nail, initial encounter: Secondary | ICD-10-CM | POA: Insufficient documentation

## 2015-08-27 MED ORDER — CEPHALEXIN 500 MG PO CAPS: 500.0000 mg | ORAL_CAPSULE | Freq: Three times a day (TID) | ORAL | Status: DC

## 2015-08-27 MED ORDER — LIDOCAINE HCL (PF) 1 % IJ SOLN
5.0000 mL | Freq: Once | INTRAMUSCULAR | Status: DC
  Filled 2015-08-27: qty 5

## 2015-08-27 NOTE — Discharge Instructions (Signed)
Nonsutured Laceration Care °A laceration is a cut that goes through all layers of the skin and extends into the tissue that is right under the skin. This type of cut is usually stitched up (sutured) or closed with tape (adhesive strips) or skin glue shortly after the injury happens. °However, if the wound is dirty or if several hours pass before medical treatment is provided, it is likely that germs (bacteria) will enter the wound. Closing a laceration after bacteria have entered it increases the risk of infection. In these cases, your health care provider may leave the laceration open (nonsutured) and cover it with a bandage. This type of treatment helps prevent infection and allows the wound to heal from the deepest layer of tissue damage up to the surface. °An open fracture is a type of injury that may involve nonsutured lacerations. An open fracture is a break in a bone that happens along with one or more lacerations through the skin that is near the fracture site. °HOW TO CARE FOR YOUR NONSUTURED LACERATION °· Take or apply over-the-counter and prescription medicines only as told by your health care provider. °· If you were prescribed an antibiotic medicine, take or apply it as told by your health care provider. Do not stop using the antibiotic even if your condition improves. °· Clean the wound one time each day or as told by your health care provider. °¨ Wash the wound with mild soap and water. °¨ Rinse the wound with water to remove all soap. °¨ Pat your wound dry with a clean towel. Do not rub the wound. °· Do not inject anything into the wound unless your health care provider told you to. °· Change any bandages (dressings) as told by your health care provider. This includes changing the dressing if it gets wet, dirty, or starts to smell bad. °· Keep the dressing dry until your health care provider says it can be removed. Do not take baths, swim, or do anything that puts your wound underwater until your  health care provider approves. °· Raise (elevate) the injured area above the level of your heart while you are sitting or lying down, if possible. °· Do not scratch or pick at the wound. °· Check your wound every day for signs of infection. Watch for: °¨ Redness, swelling, or pain. °¨ Fluid, blood, or pus. °· Keep all follow-up visits as told by your health care provider. This is important. °SEEK MEDICAL CARE IF: °· You received a tetanus and shot and you have swelling, severe pain, redness, or bleeding at the injection site.   °· You have a fever. °· Your pain is not controlled with medicine. °· You have increased redness, swelling, or pain at the site of your wound. °· You have fluid, blood, or pus coming from your wound. °· You notice a bad smell coming from your wound or your dressing. °· You notice something coming out of the wound, such as wood or glass. °· You notice a change in the color of your skin near your wound. °· You develop a new rash. °· You need to change the dressing frequently due to fluid, blood, or pus draining from the wound. °· You develop numbness around your wound. °SEEK IMMEDIATE MEDICAL CARE IF: °· Your pain suddenly increases and is severe. °· You develop severe swelling around the wound. °· The wound is on your hand or foot and you cannot properly move a finger or toe. °· The wound is on your hand or   foot and you notice that your fingers or toes look pale or bluish. °· You have a red streak going away from your wound. °  °This information is not intended to replace advice given to you by your health care provider. Make sure you discuss any questions you have with your health care provider. °  °Document Released: 05/09/2006 Document Revised: 10/26/2014 Document Reviewed: 06/07/2014 °Elsevier Interactive Patient Education ©2016 [employer] ° °

## 2015-08-27 NOTE — ED Notes (Signed)
Finger tip avulsion L index finger, cut at work this am.

## 2015-08-27 NOTE — ED Notes (Signed)
Discussed discharge instructions, prescriptions, and follow-up care with patient. No questions or concerns at this time. Pt stable at discharge.  

## 2015-08-27 NOTE — ED Provider Notes (Signed)
[facility] Emergency Department Provider Note  ____________________________________________  Time seen: Approximately 1:24 PM  I have reviewed the triage vital signs and the nursing notes.   HISTORY  Chief Complaint Finger Injury    HPI Dana Daugherty is a 30 y.o. female , NAD, reports to the emergency department with laceration to the tip of her left index finger that occurred at work earlier today. States she was cutting items with a knife while at work, someone spoke with her, she turned away and cut her finger. Denies numbness, weakness, tingling. No blunt trauma or crush injury to the hand. Notes she covered the finger with gauze but has been bleeding since earlier this morning.Patient notes pain is bearable last something touches the area in which increases her pain to 10 out of 10.   Past Medical History  Diagnosis Date  . No pertinent past medical history   . Medical history non-contributory   . Thrombocytopenia (HCC)     with last pregnancy    Patient Active Problem List   Diagnosis Date Noted  . Encounter to establish care 06/10/2015  . Myalgia 06/10/2015    Past Surgical History  Procedure Laterality Date  . No past surgeries    . Laparoscopy N/A 03/23/2015    Procedure: LAPAROSCOPY DIAGNOSTIC;  Surgeon: Hildred Laser, MD;  Location: [facility] ORS;  Service: Gynecology;  Laterality: N/A;  . Ruptiured ovarian cyst      Current Outpatient Rx  Name  Route  Sig  Dispense  Refill  . cephALEXin (KEFLEX) 500 MG capsule   Oral   Take 1 capsule (500 mg total) by mouth 3 (three) times daily.   21 capsule   0   . cyclobenzaprine (FLEXERIL) 5 MG tablet   Oral   Take 1 tablet (5 mg total) by mouth at bedtime.   30 tablet   0     Allergies Review of patient's allergies indicates no known allergies.  Family History  Problem Relation Age of Onset  . Diabetes Paternal Grandfather   . Alzheimer's disease Paternal Grandfather     Social  History Social History  Substance Use Topics  . Smoking status: Current Every Day Smoker -- 0.50 packs/day    Types: Cigarettes  . Smokeless tobacco: None  . Alcohol Use: No     Comment: occasional wine     Review of Systems  Constitutional: No fever/chills Cardiovascular: No chest pain. Respiratory:  No shortness of breath. No wheezing.  Gastrointestinal: No abdominal pain.  No nausea, vomiting.   Musculoskeletal: Positive pain about distal left index finger. Negative for hand, wrist pain.  Skin: Positive laceration tip of left index finger. Negative for rash or bruising. Neurological: Negative for headaches, focal weakness or numbness. 10-point ROS otherwise negative.  ____________________________________________   PHYSICAL EXAM:  VITAL SIGNS: ED Triage Vitals  Enc Vitals Group     BP 08/27/15 1237 126/79 mmHg     Pulse Rate 08/27/15 1237 100     Resp 08/27/15 1237 18     Temp 08/27/15 1237 98.4 F (36.9 C)     Temp Source 08/27/15 1237 Oral     SpO2 08/27/15 1237 99 %     Weight 08/27/15 1237 130 lb (58.968 kg)     Height 08/27/15 1237  (1.702 m)     Head Cir --      Peak Flow --      Pain Score 08/27/15 1238 10     Pain Loc --  Pain Edu? --      Excl. in GC? --     Constitutional: Alert and oriented. Well appearing and in no acute distress. Eyes: Conjunctivae are normal. PERRL. EOMI without pain.  Head: Atraumatic. Neck: Supple with full range of motion Cardiovascular:  Good peripheral circulation. Respiratory: Normal respiratory effort without tachypnea or retractions.  Musculoskeletal: Full range of motion of the left index finger without tenderness of any joint. No joint effusions. Neurologic:  Normal speech and language. No gross focal neurologic deficits are appreciated.  Skin:  1 cm avulsion laceration to the tip of the left index finger medially. Bleeding is controlled with evidence of clot formation superficially. Skin is warm, dry and  intact. No rash noted. Psychiatric: Mood and affect are normal. Speech and behavior are normal. Patient exhibits appropriate insight and judgement.   ____________________________________________   LABS  None  ____________________________________________  EKG  None ____________________________________________  RADIOLOGY  None  ____________________________________________    PROCEDURES  Procedure(s) performed: None  Attempted to inject lidocaine 1% w/o epi to decrease patient's pain, but she could not tolerate the injection. Patient noted to have significantly increased anxiety and possible panic.  She gripped and pulled away at each attempt, therefore the medication was not given.     Medications  lidocaine (PF) (XYLOCAINE) 1 % injection 5 mL (not administered)     ____________________________________________   INITIAL IMPRESSION / ASSESSMENT AND PLAN / ED COURSE  Patient's diagnosis is consistent with laceration of left index finger. Patient will be discharged home with prescriptions for Keflex to take as directed.  May take Tylenol and/or ibuprofen as needed for pain. Please leave the dressing that was applied while in the emergency department on the finger for 24-48 hrs. Patient is to follow up with primary care provider in 48 hours for wound check. Patient is given ED precautions to return to the ED for any worsening or new symptoms.    ____________________________________________  FINAL CLINICAL IMPRESSION(S) / ED DIAGNOSES  Final diagnoses:  Laceration of left index finger w/o foreign body w/o damage to nail, initial encounter      NEW MEDICATIONS STARTED DURING THIS VISIT:  New Prescriptions   CEPHALEXIN (KEFLEX) 500 MG CAPSULE    Take 1 capsule (500 mg total) by mouth 3 (three) times daily.         Hope PigeonJami L Leola Fiore, PA-C 08/27/15 1426  Sharman CheekPhillip Stafford, MD 08/27/15 [phone]

## 2015-08-31 ENCOUNTER — Encounter: Payer: Self-pay | Admitting: Family Medicine

## 2015-08-31 ENCOUNTER — Ambulatory Visit (INDEPENDENT_AMBULATORY_CARE_PROVIDER_SITE_OTHER): Payer: 59 | Admitting: Family Medicine

## 2015-08-31 VITALS — BP 104/68 | HR 80 | Temp 98.1°F | Ht 67.0 in | Wt 130.2 lb

## 2015-08-31 DIAGNOSIS — IMO0002 Reserved for concepts with insufficient information to code with codable children: Secondary | ICD-10-CM | POA: Insufficient documentation

## 2015-08-31 DIAGNOSIS — S61211A Laceration without foreign body of left index finger without damage to nail, initial encounter: Secondary | ICD-10-CM

## 2015-08-31 NOTE — Assessment & Plan Note (Addendum)
Patient with laceration of left index finger several days ago. No signs of infection. She is taking Keflex. This is well-healing with serosanguineous material and scab. Bandage was removed using sterile saline to moisten the padding as it was stuck to her scab. We will redress the area and patient will finish her course of Keflex. She will change the dressing twice daily. At night prior to bed she will let the area air out. She will follow-up in one week for recheck. She will alternate Tylenol and ibuprofen for discomfort. She will call if this is not satisfactory for pain control. She'll continue to monitor. She's given return precautions.

## 2015-08-31 NOTE — Patient Instructions (Signed)
Nice to meet you. Please continue to monitor the area of laceration. Please finish your course of Keflex. If you develop increased pain, redness, drainage, fevers, or any new or changing symptoms please seek medical attention.

## 2015-08-31 NOTE — Progress Notes (Signed)
Patient ID: Annalee Gentashley Bates Faulkenberry, female   DOB: 1986/02/19, 30 y.o.   MRN: [id number]  Marikay AlarEric Jayra Choyce, MD Phone: [phone]  Phineas Realshley Bates Silvio Claymanmaral is a 30 y.o. female who presents today for same-day visit.  Patient presents for follow-up from the emergency room. She was seen there on 08/27/15 for laceration of her left index finger. Noted to have a 1 cm laceration following cutting her finger with a knife while chopping food at work. They attempted to numb her to provide pain relief, though the patient had anxiety regarding this and this did not occur. She's been on Keflex since discharge from the ED. She denies fevers. She denies redness. She denies drainage. She still has the initial dressing on it. She does note some throbbing at night, though no pain during the day.  PMH: Smoker.   ROS the history of present illness  Objective  Physical Exam Filed Vitals:   08/31/15 1318  BP: 104/68  Pulse: 80  Temp: 98.1 F (36.7 C)    BP Readings from Last 3 Encounters:  08/31/15 104/68  08/27/15 126/79  06/10/15 108/70   Wt Readings from Last 3 Encounters:  08/31/15 130 lb 3.2 oz (59.058 kg)  08/27/15 130 lb (58.968 kg)  06/10/15 134 lb 6.4 oz (60.963 kg)    Physical Exam  Constitutional: She is well-developed, well-nourished, and in no distress.  HENT:  Head: Normocephalic and atraumatic.  Cardiovascular: Normal rate, regular rhythm and normal heart sounds.   Pulmonary/Chest: Effort normal and breath sounds normal.  Skin: She is not diaphoretic.  Left index finger wrapped in gauze. Gauze removed with padding still on fingertip attached to scab and unable to remove given patient's anxiety and discomfort. Sterile saline was used to moisten the pad to remove from the area. Once removed it revealed an Area of laceration about 1 cm in length with overlying serosanguineous material and scab, there is some tenderness, there is no surrounding erythema, there is no purulent drainage, sensation  to light touch intact in the fingertip, 5 out of 5 strength in extension and flexion of fingertip, good capillary refill     Assessment/Plan: Please see individual problem list.  Laceration of left index finger Patient with laceration of left index finger several days ago. No signs of infection. She is taking Keflex. This is well-healing with serosanguineous material and scab. Bandage was removed using sterile saline to moisten the padding as it was stuck to her scab. We will redress the area and patient will finish her course of Keflex. She will change the dressing twice daily. At night prior to bed she will let the area air out. She will follow-up in one week for recheck. She will alternate Tylenol and ibuprofen for discomfort. She will call if this is not satisfactory for pain control. She'll continue to monitor. She's given return precautions.    Marikay AlarEric Adlai Sinning, MD [facility] [place]

## 2015-08-31 NOTE — Progress Notes (Signed)
Pre visit review using our clinic review tool, if applicable. No additional management support is needed unless otherwise documented below in the visit note. 

## 2015-09-07 ENCOUNTER — Ambulatory Visit: Payer: 59 | Admitting: Family Medicine

## 2016-07-27 ENCOUNTER — Ambulatory Visit (INDEPENDENT_AMBULATORY_CARE_PROVIDER_SITE_OTHER): Payer: Self-pay | Admitting: Family Medicine

## 2016-07-27 ENCOUNTER — Encounter: Payer: Self-pay | Admitting: Family Medicine

## 2016-07-27 VITALS — BP 110/72 | HR 94 | Temp 99.4°F | Wt 136.4 lb

## 2016-07-27 DIAGNOSIS — J40 Bronchitis, not specified as acute or chronic: Secondary | ICD-10-CM

## 2016-07-27 DIAGNOSIS — R05 Cough: Secondary | ICD-10-CM

## 2016-07-27 DIAGNOSIS — R059 Cough, unspecified: Secondary | ICD-10-CM

## 2016-07-27 DIAGNOSIS — M5442 Lumbago with sciatica, left side: Secondary | ICD-10-CM

## 2016-07-27 DIAGNOSIS — M5441 Lumbago with sciatica, right side: Secondary | ICD-10-CM

## 2016-07-27 LAB — POCT URINALYSIS DIPSTICK
Bilirubin, UA: NEGATIVE
Blood, UA: NEGATIVE
GLUCOSE UA: NEGATIVE
Ketones, UA: NEGATIVE
Leukocytes, UA: NEGATIVE
Nitrite, UA: NEGATIVE
Protein, UA: NEGATIVE
Urobilinogen, UA: 0.2
pH, UA: 6

## 2016-07-27 LAB — POCT INFLUENZA A/B
INFLUENZA B, POC: NEGATIVE
Influenza A, POC: NEGATIVE

## 2016-07-27 LAB — POCT RAPID STREP A (OFFICE): Rapid Strep A Screen: NEGATIVE

## 2016-07-27 MED ORDER — AZITHROMYCIN 250 MG PO TABS: ORAL_TABLET | ORAL | 0 refills | Status: DC

## 2016-07-27 NOTE — Addendum Note (Signed)
Addended by: Glori LuisSONNENBERG, Kyo Cocuzza G on: 07/27/2016 10:35 AM   Modules accepted: Orders

## 2016-07-27 NOTE — Progress Notes (Signed)
  Marikay AlarEric Everest Brod, MD Phone: [phone]  Phineas Realshley Bates Silvio Claymanmaral is a 31 y.o. female who presents today for same-day visit.  Patient notes for the last 4 days she's had sore throat, chest congestion, and cough productive of green mucus. She notes minimal shortness of breath and wheezing. She's felt feverish. She additionally notes some mild low back pain that does shoot down her left greater than right leg. No urine frequency, urgency, or dysuria. No numbness or weakness. No saddle anesthesia. No bowel or bladder incontinence. Patient additionally notes a rash on her upper left back. She notes mild nausea though no vomiting or diarrhea or abdominal pain.  PMH: Smoker   ROS see history of present illness  Objective  Physical Exam Vitals:   07/27/16 0943  BP: 110/72  Pulse: 94  Temp: 99.4 F (37.4 C)    BP Readings from Last 3 Encounters:  07/27/16 110/72  08/31/15 104/68  08/27/15 126/79   Wt Readings from Last 3 Encounters:  07/27/16 136 lb 6.4 oz (61.9 kg)  08/31/15 130 lb 3.2 oz (59.1 kg)  08/27/15 130 lb (59 kg)    Physical Exam  Constitutional: No distress.  HENT:  Head: Normocephalic and atraumatic.  Mild posterior oropharyngeal erythema with no tonsillar exudate or tonsillar enlargement  Eyes: Conjunctivae are normal. Pupils are equal, round, and reactive to light.  Neck: Neck supple.  Cardiovascular: Normal rate, regular rhythm and normal heart sounds.   Pulmonary/Chest: Effort normal and breath sounds normal.  Abdominal: Soft. Bowel sounds are normal. She exhibits no distension. There is no tenderness. There is no rebound and no guarding.  Musculoskeletal: She exhibits no edema.  No midline spine tenderness, no midline spine step-off, mild lumbar muscular back tenderness, no overlying skin changes  Lymphadenopathy:    She has no cervical adenopathy.  Neurological: She is alert. Gait normal.  5 out of 5 strength bilateral quads, hamstrings, plantar flexion, and  dorsiflexion, sensation to light touch intact in bilateral lower extremities  Skin: Skin is warm and dry. She is not diaphoretic.  Few scattered erythematous papules left upper back     Assessment/Plan: Please see individual problem list.  Bronchitis Patient's symptoms likely related to bronchitis. Could be bacterial given that she's not had any improvement. Benign lung exam. Doubt pneumonia. She had negative flu test, rapid strep test, and urinalysis. Suspect back discomfort is likely related to chronic musculoskeletal issue as she has had this previously. Did discuss obtaining an x-ray of her lumbar spine given history of this though patient decided to defer this. We will send her urine for culture given her reported history of kidney infections with no symptoms. We will treat her with azithromycin for bronchitis. I offered an albuterol inhaler though she declined. She'll monitor. Given return precautions.   Orders Placed This Encounter  Procedures  . POCT Influenza A/B  . POCT rapid strep A  . POCT Urinalysis Dipstick    Meds ordered this encounter  Medications  . azithromycin (ZITHROMAX) 250 MG tablet    Sig: Take 500 mg (2 tablets) by mouth today, then take 250 mg (one tablet) by mouth daily    Dispense:  6 tablet    Refill:  0    Marikay AlarEric Uri Turnbough, MD [facility] [place]

## 2016-07-27 NOTE — Patient Instructions (Signed)
Nice to see you. We are going to start you on azithromycin for bronchitis. If you develop trouble breathing, wheezing, fevers, cough productive of blood, worsening back pain, numbness, weakness, loss of bowel or bladder function, numbness between her legs, or any new or worsening symptoms please seek medical attention immediately.

## 2016-07-27 NOTE — Progress Notes (Signed)
Pre visit review using our clinic review tool, if applicable. No additional management support is needed unless otherwise documented below in the visit note. 

## 2016-07-27 NOTE — Assessment & Plan Note (Signed)
Patient's symptoms likely related to bronchitis. Could be bacterial given that she's not had any improvement. Benign lung exam. Doubt pneumonia. She had negative flu test, rapid strep test, and urinalysis. Suspect back discomfort is likely related to chronic musculoskeletal issue as she has had this previously. Did discuss obtaining an x-ray of her lumbar spine given history of this though patient decided to defer this. We will send her urine for culture given her reported history of kidney infections with no symptoms. We will treat her with azithromycin for bronchitis. I offered an albuterol inhaler though she declined. She'll monitor. Given return precautions.

## 2016-07-29 LAB — URINE CULTURE

## 2018-03-28 ENCOUNTER — Encounter: Payer: Self-pay | Admitting: Family Medicine

## 2018-03-28 ENCOUNTER — Ambulatory Visit (INDEPENDENT_AMBULATORY_CARE_PROVIDER_SITE_OTHER): Payer: PRIVATE HEALTH INSURANCE | Admitting: Family Medicine

## 2018-03-28 VITALS — BP 108/80 | HR 95 | Temp 98.7°F | Ht 67.0 in | Wt 133.3 lb

## 2018-03-28 DIAGNOSIS — R6889 Other general symptoms and signs: Secondary | ICD-10-CM

## 2018-03-28 DIAGNOSIS — J0140 Acute pansinusitis, unspecified: Secondary | ICD-10-CM

## 2018-03-28 DIAGNOSIS — J329 Chronic sinusitis, unspecified: Secondary | ICD-10-CM | POA: Insufficient documentation

## 2018-03-28 LAB — POC INFLUENZA A&B (BINAX/QUICKVUE)
INFLUENZA A, POC: NEGATIVE
INFLUENZA B, POC: NEGATIVE

## 2018-03-28 MED ORDER — AMOXICILLIN-POT CLAVULANATE 875-125 MG PO TABS: 1.0000 | ORAL_TABLET | Freq: Two times a day (BID) | ORAL | 0 refills | Status: DC

## 2018-03-28 NOTE — Assessment & Plan Note (Signed)
Symptoms most consistent with sinusitis.  Negative flu test.  Warrants treatment given severity at least 3 days duration.  Will treat with Augmentin.  If not improving she will contact us.

## 2018-03-28 NOTE — Progress Notes (Signed)
  Marikay Alar, MD Phone: [phone]  Dana Daugherty is a 32 y.o. female who presents today for same day visit.  CC: cough  Patient notes sudden onset of symptoms 3 days ago.  She has had cough with sore throat and drainage and significant sinus and nasal congestion.  Had slight pain in her right ear.  Has had some sweats though her temperature has been normal when she is checked it.  No shortness of breath.  Cough is productive of brownish-green mucus.  No sick contacts.  Has tried an over-the-counter cough suppressant.  Social History   Tobacco Use  Smoking Status Current Some Day Smoker  . Packs/day: 0.50  . Types: Cigarettes  Smokeless Tobacco Never Used     ROS see history of present illness  Objective  Physical Exam Vitals:   03/28/18 0949  BP: 108/80  Pulse: 95  Temp: 98.7 F (37.1 C)  SpO2: 98%    BP Readings from Last 3 Encounters:  03/28/18 108/80  07/27/16 110/72  08/31/15 104/68   Wt Readings from Last 3 Encounters:  03/28/18 133 lb 4.8 oz (60.5 kg)  07/27/16 136 lb 6.4 oz (61.9 kg)  08/31/15 130 lb 3.2 oz (59.1 kg)    Physical Exam  Constitutional: No distress.  HENT:  Head: Normocephalic and atraumatic.  1+ tonsils, mild posterior oropharyngeal erythema, no exudate, normal TMs bilaterally  Eyes: Pupils are equal, round, and reactive to light. Conjunctivae are normal.  Neck: Neck supple.  Cardiovascular: Normal rate, regular rhythm and normal heart sounds.  Pulmonary/Chest: Effort normal and breath sounds normal.  Musculoskeletal: She exhibits no edema.  Lymphadenopathy:    She has no cervical adenopathy.  Neurological: She is alert.  Skin: Skin is warm and dry. She is not diaphoretic.     Assessment/Plan: Please see individual problem list.  Sinusitis Symptoms most consistent with sinusitis.  Negative flu test.  Warrants treatment given severity at least 3 days duration.  Will treat with Augmentin.  If not improving she will contact  us.    Orders Placed This Encounter  Procedures  . POC Influenza A&B(BINAX/QUICKVUE)    Meds ordered this encounter  Medications  . amoxicillin-clavulanate (AUGMENTIN) 875-125 MG tablet    Sig: Take 1 tablet by mouth 2 (two) times daily.    Dispense:  14 tablet    Refill:  0     Marikay Alar, MD [facility]

## 2018-03-28 NOTE — Patient Instructions (Signed)
Nice to see you. You likely have a sinus infection.  We will treat you with Augmentin.  If you do not start to improve over the next 3 to 4 days please contact us.

## 2018-05-27 ENCOUNTER — Encounter: Payer: Self-pay | Admitting: Family Medicine

## 2018-05-27 ENCOUNTER — Ambulatory Visit (INDEPENDENT_AMBULATORY_CARE_PROVIDER_SITE_OTHER): Payer: PRIVATE HEALTH INSURANCE | Admitting: Family Medicine

## 2018-05-27 VITALS — BP 98/60 | HR 95 | Temp 98.2°F | Ht 67.0 in | Wt 132.8 lb

## 2018-05-27 DIAGNOSIS — R070 Pain in throat: Secondary | ICD-10-CM | POA: Diagnosis not present

## 2018-05-27 DIAGNOSIS — R52 Pain, unspecified: Secondary | ICD-10-CM | POA: Diagnosis not present

## 2018-05-27 DIAGNOSIS — J029 Acute pharyngitis, unspecified: Secondary | ICD-10-CM

## 2018-05-27 LAB — POC INFLUENZA A&B (BINAX/QUICKVUE)
INFLUENZA A, POC: NEGATIVE
Influenza B, POC: NEGATIVE

## 2018-05-27 LAB — POCT RAPID STREP A (OFFICE): Rapid Strep A Screen: NEGATIVE

## 2018-05-27 MED ORDER — AZITHROMYCIN 250 MG PO TABS: ORAL_TABLET | ORAL | 0 refills | Status: DC

## 2018-05-27 NOTE — Patient Instructions (Signed)

## 2018-05-27 NOTE — Progress Notes (Signed)
   Subjective:    Patient ID: Dana Daugherty, female    DOB: [birth year], 32 y.o.   MRN: [id number]  HPI  Presents to clinic c/o sore throat x2 days with body aches and fatigue.  Patient states she looked in the back of her throat and did see white spots on her tonsils.  Patient is not around children often, but does work at [employer] so she interacts with many people.  Patient denies any known fever or chills.  Patient Active Problem List   Diagnosis Date Noted  . Sinusitis 03/28/2018  . Bronchitis 07/27/2016  . Laceration of left index finger 08/31/2015  . Encounter to establish care 06/10/2015  . Myalgia 06/10/2015   Social History   Tobacco Use  . Smoking status: Current Some Day Smoker    Packs/day: 0.50    Types: Cigarettes  . Smokeless tobacco: Never Used  Substance Use Topics  . Alcohol use: No    Alcohol/week: 0.0 standard drinks    Comment: occasional wine   Review of Systems  Constitutional: +fatigue. Negative for chills, and fever.  HENT: +sore throat, white spot on tonsils Eyes: Negative.   Respiratory: Negative for cough, shortness of breath and wheezing.   Cardiovascular: Negative for chest pain, palpitations and leg swelling.  Gastrointestinal: Negative for abdominal pain, diarrhea, nausea and vomiting.  Genitourinary: Negative for dysuria, frequency and urgency.  Musculoskeletal:+body aches Skin: Negative for color change, pallor and rash.  Neurological: Negative for syncope, light-headedness and headaches.  Psychiatric/Behavioral: The patient is not nervous/anxious.       Objective:   Physical Exam  Constitutional: She is oriented to person, place, and time.  Non-toxic appearance. She does not appear ill.  HENT:  Head: Normocephalic and atraumatic.  Right Ear: Tympanic membrane and ear canal normal.  Left Ear: Tympanic membrane and ear canal normal.  Mouth/Throat: Uvula is midline. Posterior oropharyngeal erythema present. Tonsillar exudate.    Eyes: Pupils are equal, round, and reactive to light. EOM are normal.  Neck: Neck supple.  Cervical gland tenderness.   Cardiovascular: Normal rate and normal heart sounds.  Pulmonary/Chest: Effort normal and breath sounds normal. No respiratory distress. She has no wheezes. She has no rhonchi. She has no rales.  Neurological: She is alert and oriented to person, place, and time.  Skin: Skin is warm and dry. No pallor.  Psychiatric: She has a normal mood and affect. Her behavior is normal.  Nursing note and vitals reviewed.     Vitals:   05/27/18 1533  BP: 98/60  Pulse: 95  Temp: 98.2 F (36.8 C)  SpO2: 97%   Assessment & Plan:   Exudative pharyngitis, pain in throat, body aches-rapid flu and strep are negative in clinic.  However due to patient exam, I will treat for strep throat.  Patient states her significant other is extremely allergic to penicillins and gets upset even if she has a prescription in the home.  We will do a Z-Pak for treatment.  Patient advised to rest, increase fluid intake, do good handwashing.  Patient also advised to purchase a new toothbrush.  Suggested doing salt water gargles to help soothe sore throat and can also use a Chloraseptic spray which is available over-the-counter for throat pain.  Follow-up as regularly scheduled for annual wellness visits.  Return to clinic sooner if any issues arise.

## 2018-05-28 ENCOUNTER — Encounter: Payer: Self-pay | Admitting: Family Medicine

## 2018-05-30 LAB — CULTURE, UPPER RESPIRATORY
MICRO NUMBER: 91445918
SPECIMEN QUALITY:: ADEQUATE

## 2018-11-23 ENCOUNTER — Emergency Department: Payer: No Typology Code available for payment source

## 2018-11-23 ENCOUNTER — Other Ambulatory Visit: Payer: Self-pay

## 2018-11-23 ENCOUNTER — Emergency Department
Admission: EM | Admit: 2018-11-23 | Discharge: 2018-11-23 | Disposition: A | Discharge status: Home / Self Care | Payer: No Typology Code available for payment source | Attending: Emergency Medicine | Admitting: Emergency Medicine

## 2018-11-23 DIAGNOSIS — F17210 Nicotine dependence, cigarettes, uncomplicated: Secondary | ICD-10-CM | POA: Insufficient documentation

## 2018-11-23 DIAGNOSIS — R1030 Lower abdominal pain, unspecified: Secondary | ICD-10-CM | POA: Diagnosis present

## 2018-11-23 DIAGNOSIS — R109 Unspecified abdominal pain: Secondary | ICD-10-CM

## 2018-11-23 DIAGNOSIS — N83202 Unspecified ovarian cyst, left side: Secondary | ICD-10-CM | POA: Insufficient documentation

## 2018-11-23 DIAGNOSIS — N83209 Unspecified ovarian cyst, unspecified side: Secondary | ICD-10-CM

## 2018-11-23 LAB — CBC
HCT: 39.4 % (ref 36.0–46.0)
Hemoglobin: 12.8 g/dL (ref 12.0–15.0)
MCH: 28.6 pg (ref 26.0–34.0)
MCHC: 32.5 g/dL (ref 30.0–36.0)
MCV: 87.9 fL (ref 80.0–100.0)
Platelets: 132 10*3/uL — ABNORMAL LOW (ref 150–400)
RBC: 4.48 MIL/uL (ref 3.87–5.11)
RDW: 13.9 % (ref 11.5–15.5)
WBC: 8.1 10*3/uL (ref 4.0–10.5)
nRBC: 0 % (ref 0.0–0.2)

## 2018-11-23 LAB — COMPREHENSIVE METABOLIC PANEL
ALT: 7 U/L (ref 0–44)
AST: 19 U/L (ref 15–41)
Albumin: 4.2 g/dL (ref 3.5–5.0)
Alkaline Phosphatase: 49 U/L (ref 38–126)
Anion gap: 7 (ref 5–15)
BUN: 10 mg/dL (ref 6–20)
CO2: 25 mmol/L (ref 22–32)
Calcium: 9.3 mg/dL (ref 8.9–10.3)
Chloride: 106 mmol/L (ref 98–111)
Creatinine, Ser: 0.67 mg/dL (ref 0.44–1.00)
GFR calc Af Amer: >60 mL/min (ref >60)
GFR calc non Af Amer: >60 mL/min (ref >60)
Glucose, Bld: 110 mg/dL — ABNORMAL HIGH (ref 70–99)
Potassium: 3.9 mmol/L (ref 3.5–5.1)
Sodium: 138 mmol/L (ref 135–145)
Total Bilirubin: 0.7 mg/dL (ref 0.3–1.2)
Total Protein: 7.3 g/dL (ref 6.5–8.1)

## 2018-11-23 LAB — URINALYSIS, COMPLETE (UACMP) WITH MICROSCOPIC
Bilirubin Urine: NEGATIVE
Glucose, UA: NEGATIVE mg/dL
Hgb urine dipstick: NEGATIVE
Ketones, ur: NEGATIVE mg/dL
Leukocytes,Ua: NEGATIVE
Nitrite: NEGATIVE
Protein, ur: NEGATIVE mg/dL
Specific Gravity, Urine: 1.001 — ABNORMAL LOW (ref 1.005–1.030)
WBC, UA: NONE SEEN WBC/hpf (ref 0–5)
pH: 7 (ref 5.0–8.0)

## 2018-11-23 LAB — LIPASE, BLOOD: Lipase: 28 U/L (ref 11–51)

## 2018-11-23 LAB — POCT PREGNANCY, URINE: Preg Test, Ur: NEGATIVE

## 2018-11-23 MED ORDER — IOHEXOL 300 MG/ML  SOLN
100.0000 mL | Freq: Once | INTRAMUSCULAR | Status: AC | PRN
  Administered 2018-11-23: 100 mL via INTRAVENOUS
  Filled 2018-11-23: qty 100

## 2018-11-23 MED ORDER — HYDROCODONE-ACETAMINOPHEN 5-325 MG PO TABS: 1.0000 | ORAL_TABLET | Freq: Four times a day (QID) | ORAL | 0 refills | Status: AC | PRN

## 2018-11-23 NOTE — ED Notes (Signed)
Pt verbalized understanding of discharge instructions. NAD at this time. 

## 2018-11-23 NOTE — ED Provider Notes (Signed)
[facility] Emergency Department Provider Note  ____________________________________________   First MD Initiated Contact with Patient 11/23/18 1328     (approximate)  I have reviewed the triage vital signs and the nursing notes.   HISTORY  Chief Complaint Abdominal Pain    HPI Dana Daugherty is a 33 y.o. female presents emergency department complaining of lower abdominal pain following intercourse 2 days ago.  She had similar symptoms in which she had a ruptured cyst.  She still has her appendix.  She denies any nausea or vomiting.  She denies fever chills.    Past Medical History:  Diagnosis Date  . Medical history non-contributory   . No pertinent past medical history   . Thrombocytopenia (HCC)    with last pregnancy    Patient Active Problem List   Diagnosis Date Noted  . Sinusitis 03/28/2018  . Bronchitis 07/27/2016  . Laceration of left index finger 08/31/2015  . Encounter to establish care 06/10/2015  . Myalgia 06/10/2015    Past Surgical History:  Procedure Laterality Date  . LAPAROSCOPY N/A 03/23/2015   Procedure: LAPAROSCOPY DIAGNOSTIC;  Surgeon: Hildred LaserAnika Cherry, MD;  Location: [facility] ORS;  Service: Gynecology;  Laterality: N/A;  . NO PAST SURGERIES    . ruptiured ovarian cyst      Prior to Admission medications   Medication Sig Start Date End Date Taking? Authorizing Provider  HYDROcodone-acetaminophen (NORCO/VICODIN) 5-325 MG tablet Take 1 tablet by mouth every 6 (six) hours as needed for moderate pain. 11/23/18   Faythe GheeFisher, Francia Verry W, PA-C    Allergies Patient has no known allergies.  Family History  Problem Relation Age of Onset  . Diabetes Paternal Grandfather   . Alzheimer's disease Paternal Grandfather     Social History Social History   Tobacco Use  . Smoking status: Current Some Day Smoker    Packs/day: 0.50    Types: Cigarettes  . Smokeless tobacco: Never Used  Substance Use Topics  . Alcohol use: No   Alcohol/week: 0.0 standard drinks    Comment: occasional wine  . Drug use: No    Review of Systems  Constitutional: No fever/chills Eyes: No visual changes. ENT: No sore throat. Respiratory: Denies cough Gastrointestinal: Positive for lower abdominal pain Genitourinary: Negative for dysuria. Musculoskeletal: Negative for back pain. Skin: Negative for rash.    ____________________________________________   PHYSICAL EXAM:  VITAL SIGNS: ED Triage Vitals  Enc Vitals Group     BP 11/23/18 1114 129/80     Pulse Rate 11/23/18 1114 89     Resp 11/23/18 1114 16     Temp 11/23/18 1114 98.7 F (37.1 C)     Temp Source 11/23/18 1114 Oral     SpO2 11/23/18 1114 100 %     Weight 11/23/18 1114 132 lb 11 oz (60.2 kg)     Height 11/23/18 1114 5\' 7"  (1.702 m)     Head Circumference --      Peak Flow --      Pain Score 11/23/18 1121 6     Pain Loc --      Pain Edu? --      Excl. in GC? --     Constitutional: Alert and oriented. Well appearing and in no acute distress. Eyes: Conjunctivae are normal.  Head: Atraumatic. Nose: No congestion/rhinnorhea. Mouth/Throat: Mucous membranes are moist.   Neck:  supple no lymphadenopathy noted Cardiovascular: Normal rate, regular rhythm. Heart sounds are normal Respiratory: Normal respiratory effort.  No retractions, lungs c  t a  Abd: soft tender in lower quadrants bilaterally Bs normal all 4 quad GU: deferred Musculoskeletal: FROM all extremities, warm and well perfused Neurologic:  Normal speech and language.  Skin:  Skin is warm, dry and intact. No rash noted. Psychiatric: Mood and affect are normal. Speech and behavior are normal.  ____________________________________________   LABS (all labs ordered are listed, but only abnormal results are displayed)  Labs Reviewed  COMPREHENSIVE METABOLIC PANEL - Abnormal; Notable for the following components:      Result Value   Glucose, Bld 110 (*)    All other components within normal  limits  CBC - Abnormal; Notable for the following components:   Platelets 132 (*)    All other components within normal limits  URINALYSIS, COMPLETE (UACMP) WITH MICROSCOPIC - Abnormal; Notable for the following components:   Color, Urine STRAW (*)    APPearance HAZY (*)    Specific Gravity, Urine 1.001 (*)    Bacteria, UA RARE (*)    All other components within normal limits  LIPASE, BLOOD  POC URINE PREG, ED  POCT PREGNANCY, URINE   ____________________________________________   ____________________________________________  RADIOLOGY  Ultrasound does not show a ruptured cyst CT abdomen/pelvis shows blood surrounding the uterus and ovaries typical of a ruptured ovarian cyst.  ____________________________________________   PROCEDURES  Procedure(s) performed: No  Procedures    ____________________________________________   INITIAL IMPRESSION / ASSESSMENT AND PLAN / ED COURSE  Pertinent labs & imaging results that were available during my care of the patient were reviewed by me and considered in my medical decision making (see chart for details).   Patient is 33 year old female presents emergency department complaining of pain in the lower abdomen following intercourse with her husband.  Similar symptoms in the past with a ruptured ovarian cyst.  Physical exam patient is tender in the lower quads bilaterally  POC pregnancy is negative, CBC is normal, comprehensive metabolic panel is basically normal, urinalysis is normal, lipase is normal   Ultrasound does not show a ruptured ovarian cyst CT abdomen/pelvis shows hemoperitoneum, typical of a ruptured ovarian cyst  Explained findings to the patient.  She is to see her GYN tomorrow the next day.  She is to take pain medication as needed.  No work for 2 to 3 days.  Return if worsening.  As part of my medical decision making, I reviewed the following data within the electronic MEDICAL RECORD NUMBER Nursing notes reviewed and  incorporated, Labs reviewed see above, Old chart reviewed, Radiograph reviewed ultrasound negative, CT abdomen/pelvis shows ruptured ovarian cyst, Notes from prior ED visits and [place] Controlled Substance Database  ____________________________________________   FINAL CLINICAL IMPRESSION(S) / ED DIAGNOSES  Final diagnoses:  Abdominal pain  Ruptured ovarian cyst      NEW MEDICATIONS STARTED DURING THIS VISIT:  New Prescriptions   HYDROCODONE-ACETAMINOPHEN (NORCO/VICODIN) 5-325 MG TABLET    Take 1 tablet by mouth every 6 (six) hours as needed for moderate pain.     Note:  This document was prepared using Dragon voice recognition software and may include unintentional dictation errors.    Faythe Ghee, PA-C 11/23/18 1523    Arnaldo Natal, MD 11/23/18 [phone]

## 2018-11-23 NOTE — Discharge Instructions (Signed)
Follow-up with Dr. Claiborne Billings.  Please call tomorrow for an appointment.  Take pain medication as needed.

## 2018-11-23 NOTE — ED Triage Notes (Signed)
Pt presents via POV c/o lower abd pain following intercourse. Reports hx ruptured cyst with similar pain.

## 2019-07-22 ENCOUNTER — Ambulatory Visit: Payer: No Typology Code available for payment source | Attending: Internal Medicine

## 2019-07-22 DIAGNOSIS — Z20822 Contact with and (suspected) exposure to covid-19: Secondary | ICD-10-CM

## 2019-07-23 LAB — NOVEL CORONAVIRUS, NAA: SARS-CoV-2, NAA: NOT DETECTED

## 2019-07-24 ENCOUNTER — Telehealth: Payer: Self-pay | Admitting: *Deleted

## 2019-07-24 NOTE — Telephone Encounter (Signed)
Patient called requesting results are faxed to [facility]  Fax [phone]

## 2019-10-08 IMAGING — US US PELVIS COMPLETE
1 series · 14 of 25 positions shown · non-contrast
Comparison: Pelvic ultrasound 03/23/2015.

CLINICAL DATA: Pelvic pain for 4 hours.



[Series 1: us pelvis complete · 68 acquisitions, 14 frames shown]
[im 1/68]
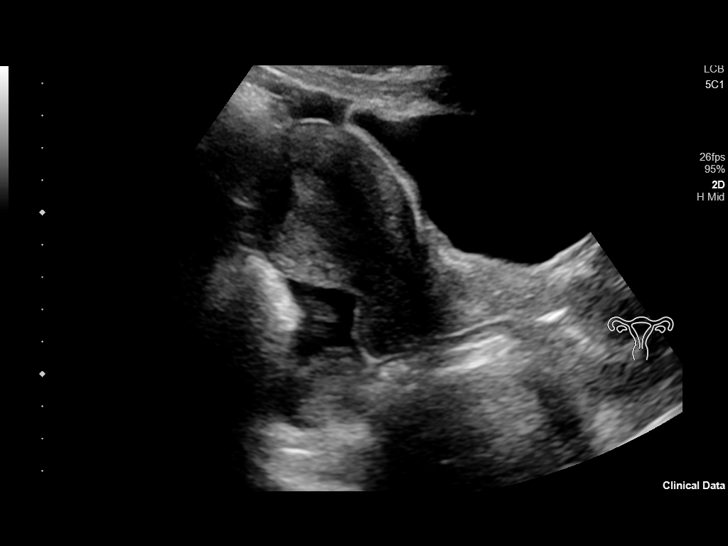
[im 6/68]
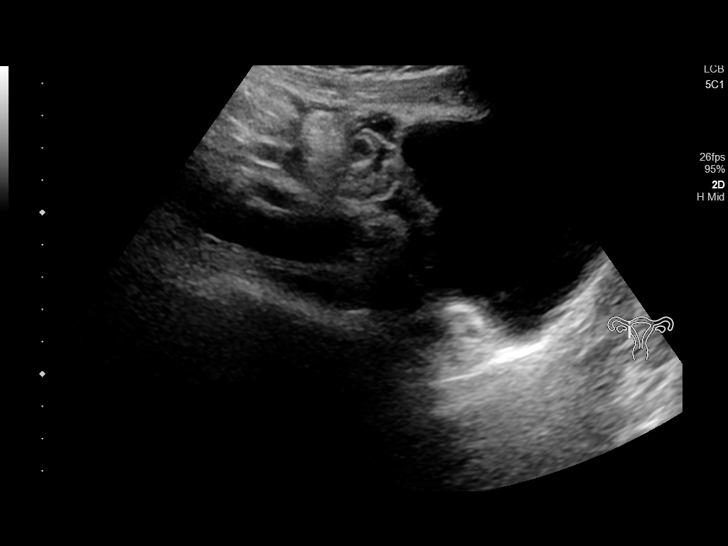
[im 12/68]
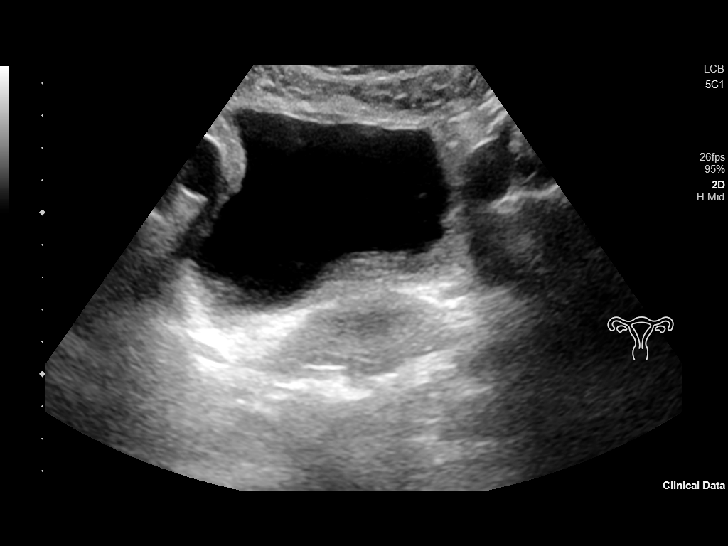
[im 17/68]
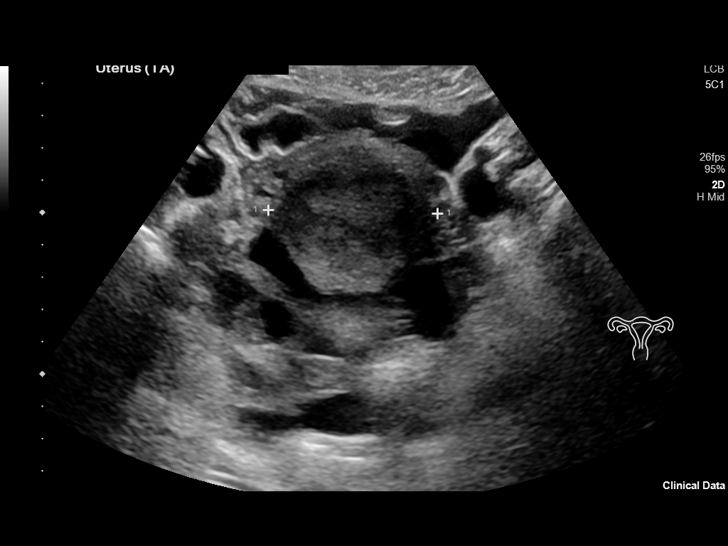
[im 23/68]
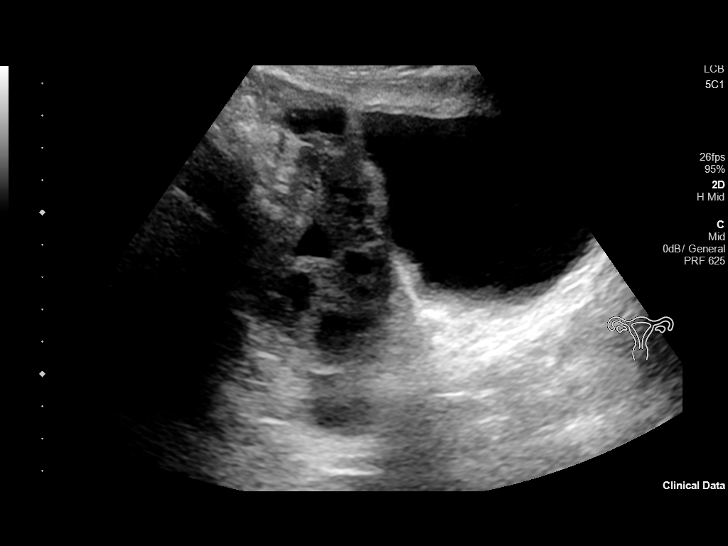
[im 26/68]
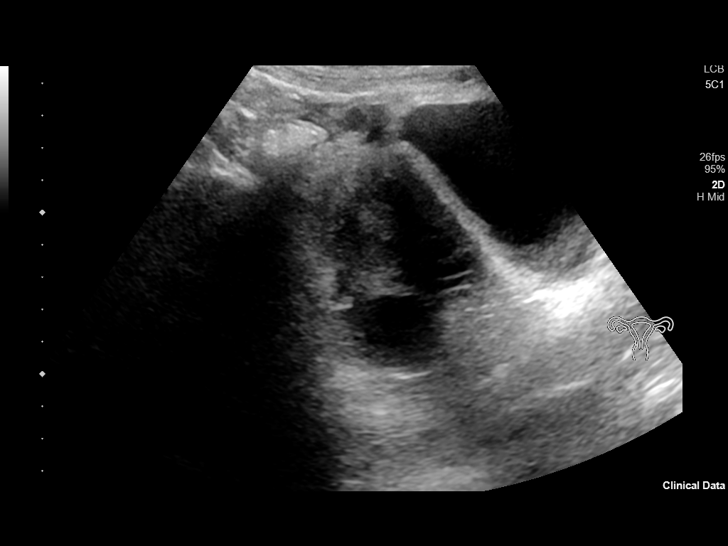
[im 31/68]
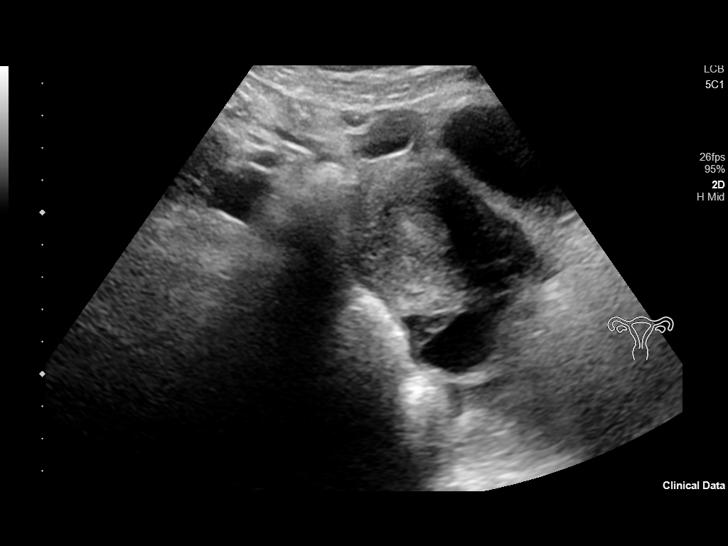
[im 37/68]
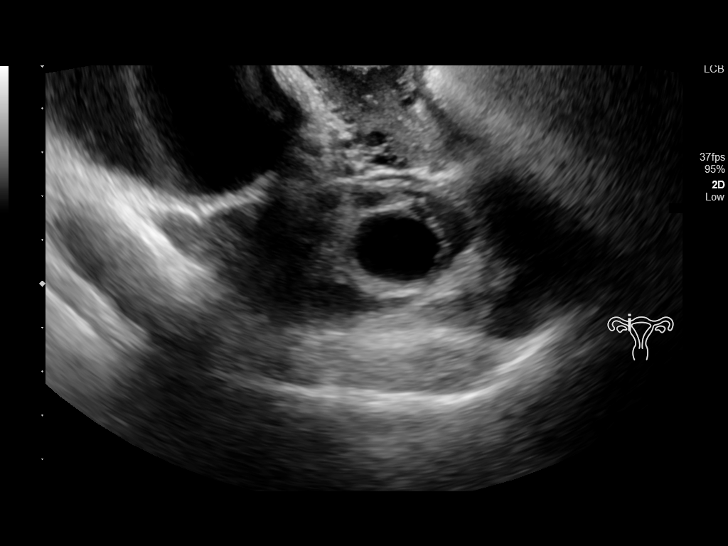
[im 42/68]
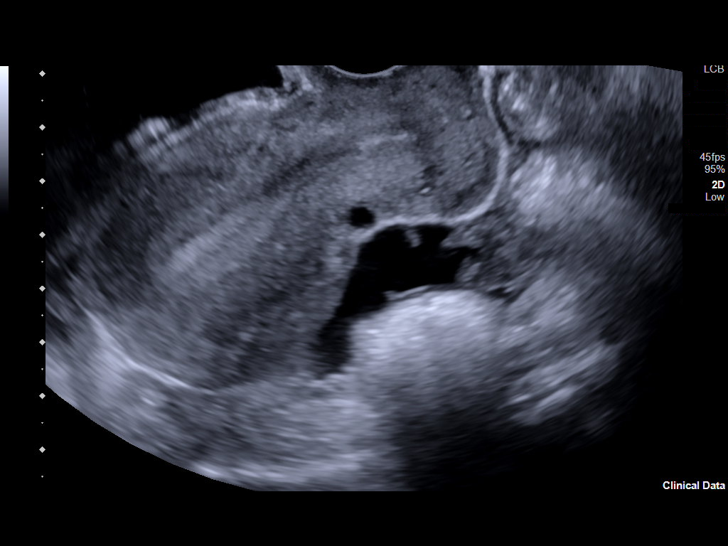
[im 45/68]
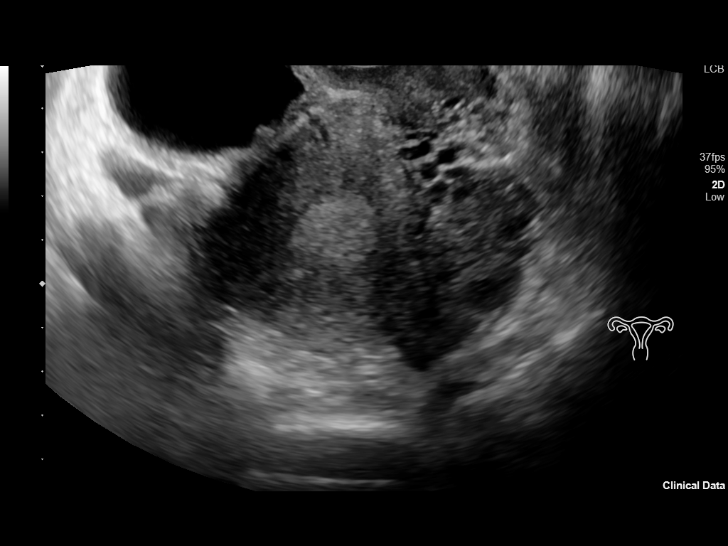
[im 51/68]
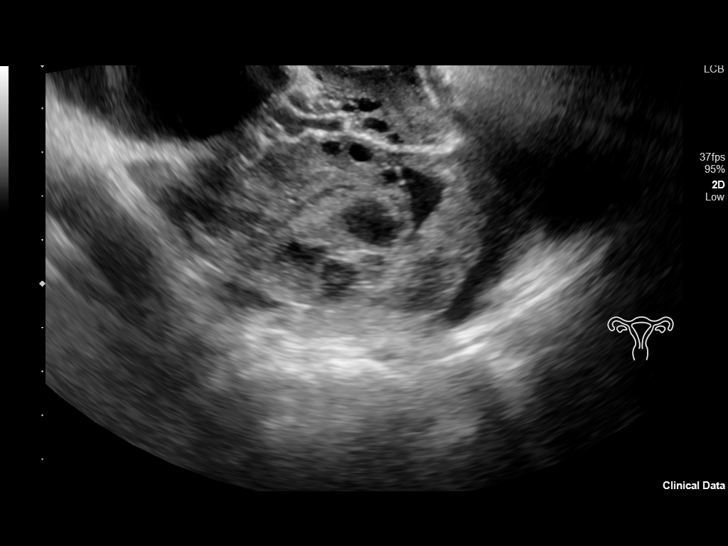
[im 56/68]
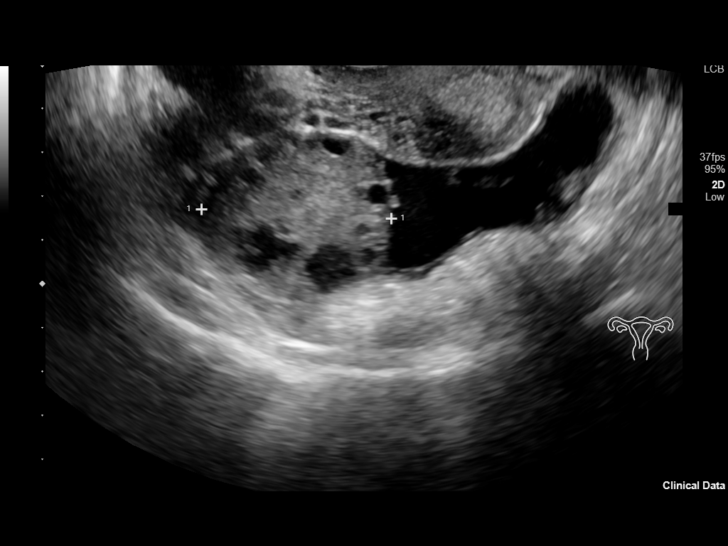
[im 62/68]
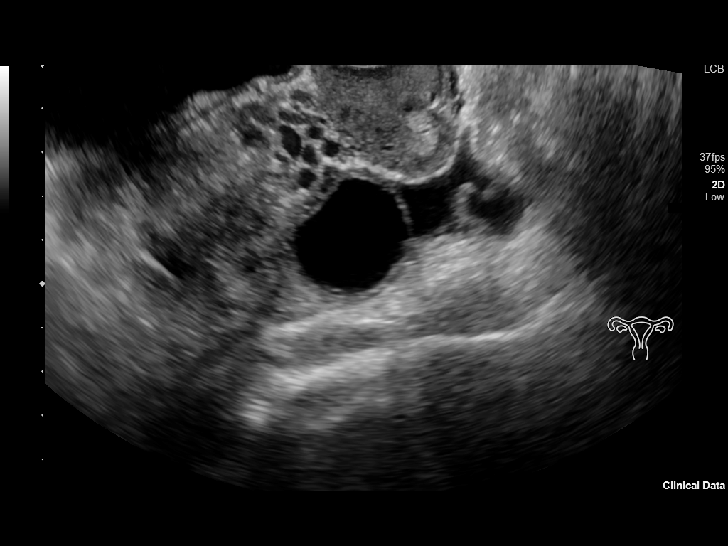
[im 68/68]
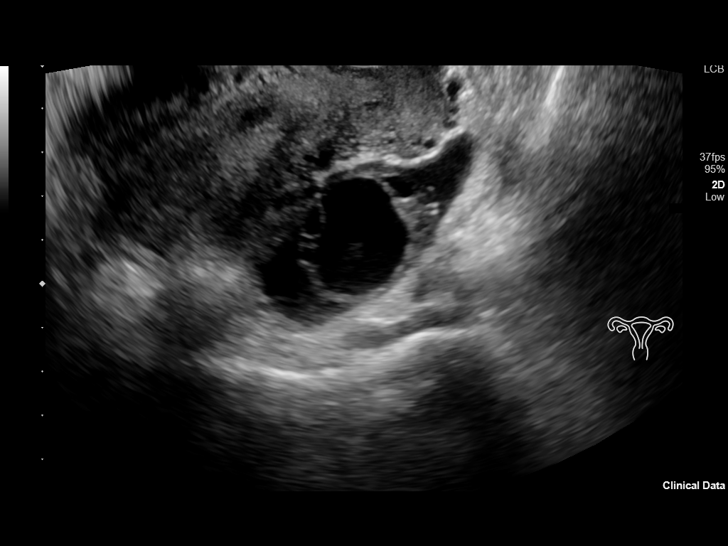

[14 of 25 positions shown; findings below may reference images not displayed]

FINDINGS: Uterus

Measurements: 8.0 x 4.7 x 5.2 cm = volume: 102 mL. No fibroids or
other mass visualized.

Endometrium

Thickness: 11 mm.  No focal abnormality visualized.

Right ovary

Measurements: 4.3 x 3.9 x 4.3 cm = volume: 38 mL. Normal
appearance/no adnexal mass.

Left ovary

Measurements: 3.9 x 3.1 x 3.4 cm = volume: 21 mL. Normal
appearance/no adnexal mass. Simple 2.7 cm in diameter cyst
incidentally noted.

Other findings

No abnormal free fluid.
IMPRESSION: Negative exam.  No finding to explain the patient's symptoms.

## 2019-10-08 IMAGING — CT CT ABDOMEN AND PELVIS WITH CONTRAST
2 of 4 series · 16 of 46 positions shown, 18 images · IV contrast (APPLIED)
Comparison: Ultrasound studies same day

CLINICAL DATA: Abdominal and pelvic pain beginning today.

EXAM:
CT ABDOMEN AND PELVIS WITH CONTRAST
TECHNIQUE: Multidetector CT imaging of the abdomen and pelvis was performed
using the standard protocol following bolus administration of
intravenous contrast.
CONTRAST:  100mL OMNIPAQUE IOHEXOL 300 MG/ML  SOLN

[Series 2: routine abd/pel with · axial · 0.64mm/px · z∈[-1011,-616]mm · 13 of 87 slices shown, 15 images]
[im 4/87  soft-tissue]
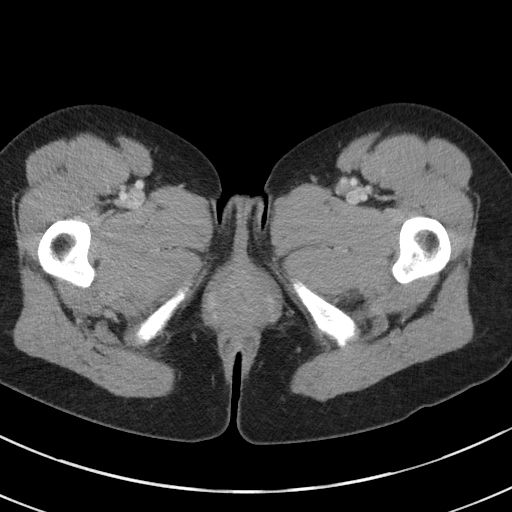
[im 4/87  bone]
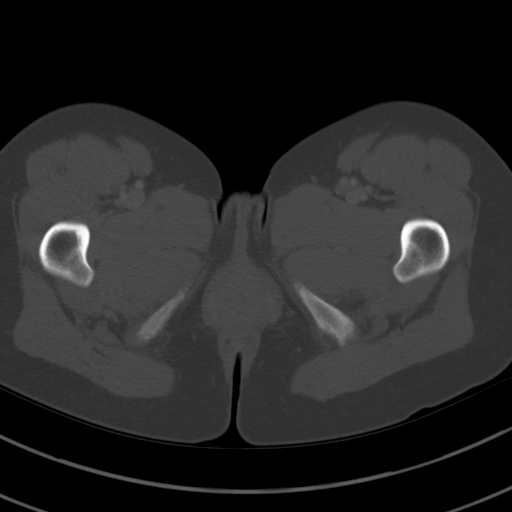
[im 11/87  soft-tissue]
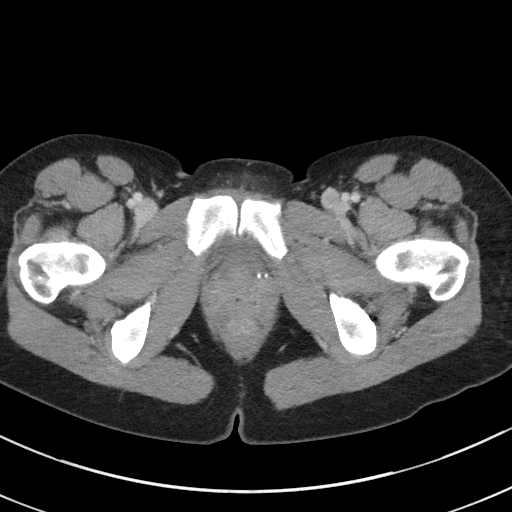
[im 18/87  soft-tissue]
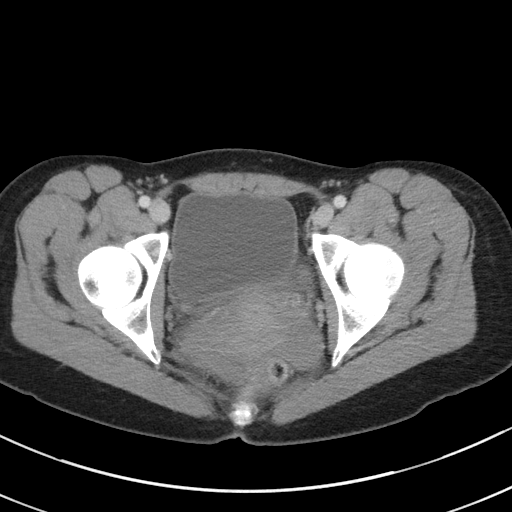
[im 26/87  soft-tissue]
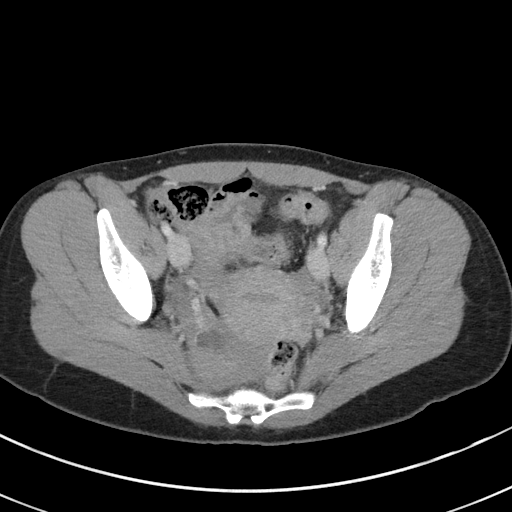
[im 29/87  soft-tissue]
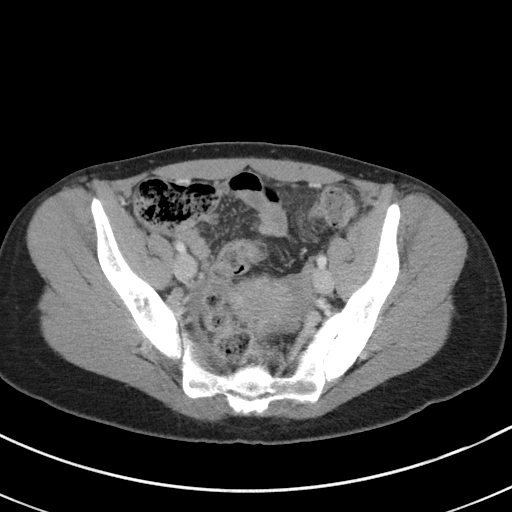
[im 36/87  soft-tissue]
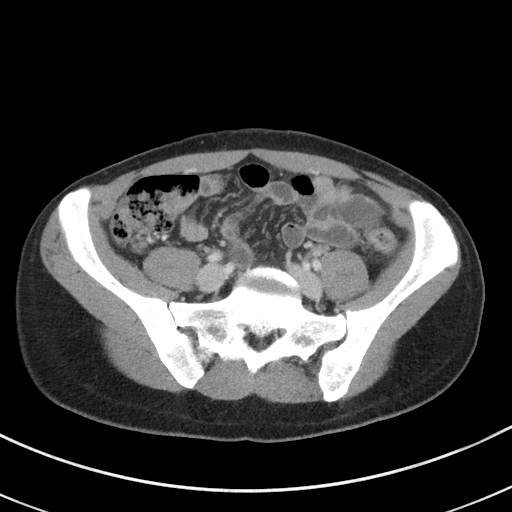
[im 44/87  soft-tissue]
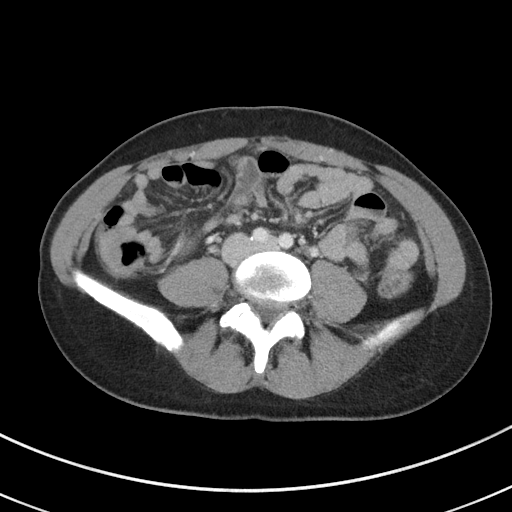
[im 51/87  soft-tissue]
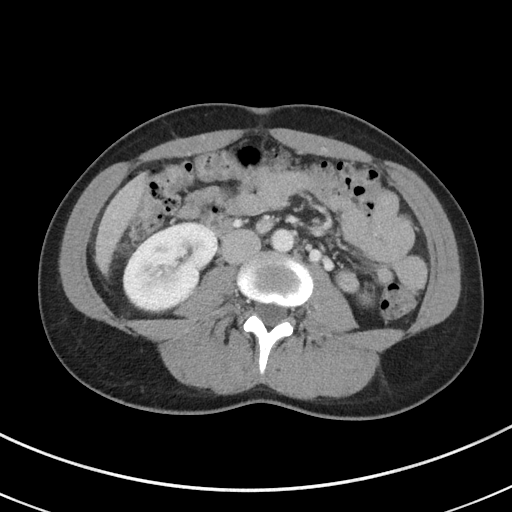
[im 58/87  soft-tissue]
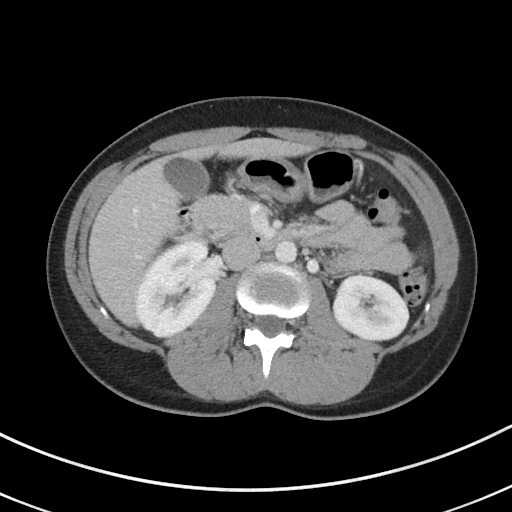
[im 58/87  bone]
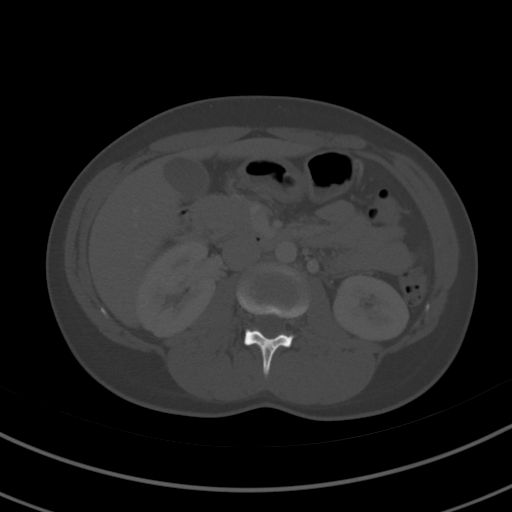
[im 61/87  soft-tissue]
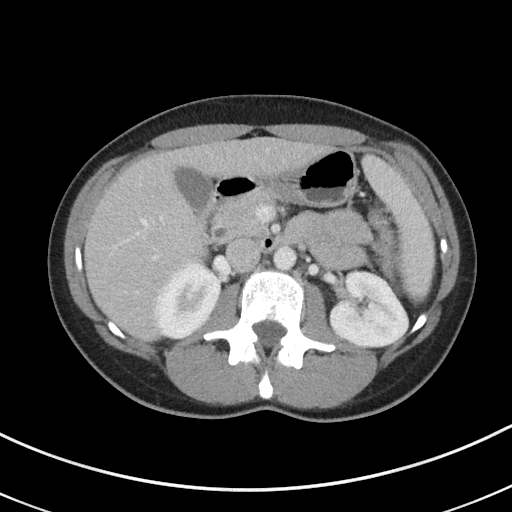
[im 69/87  soft-tissue]
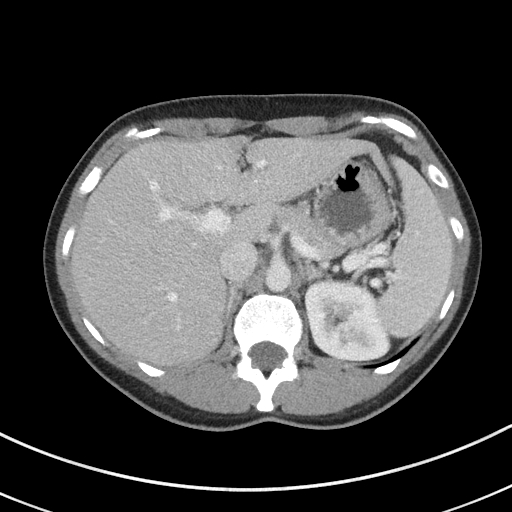
[im 76/87  soft-tissue]
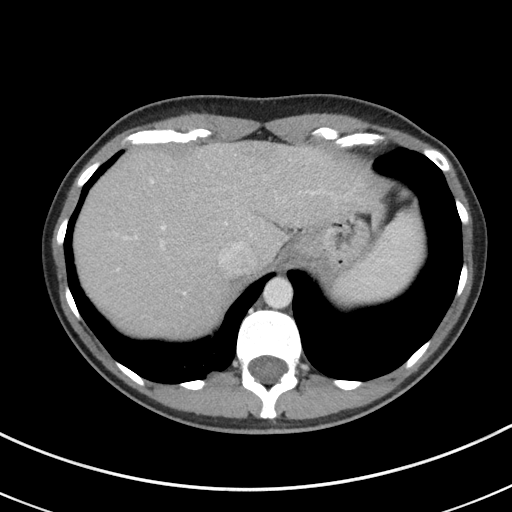
[im 83/87  soft-tissue]
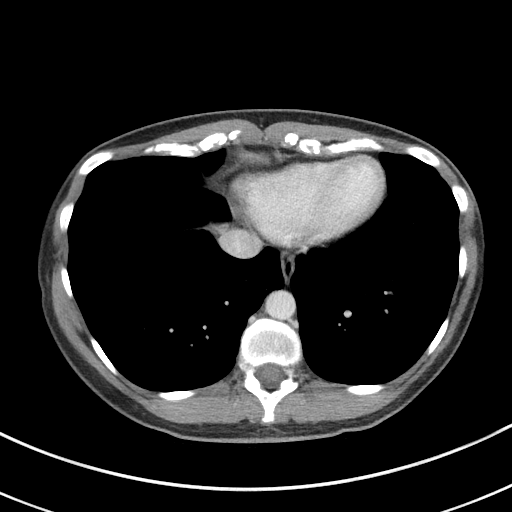

[Series 5: coronal st · coronal · 0.62mm/px · 3 of 75 slices shown]
[im 25/75  soft-tissue]
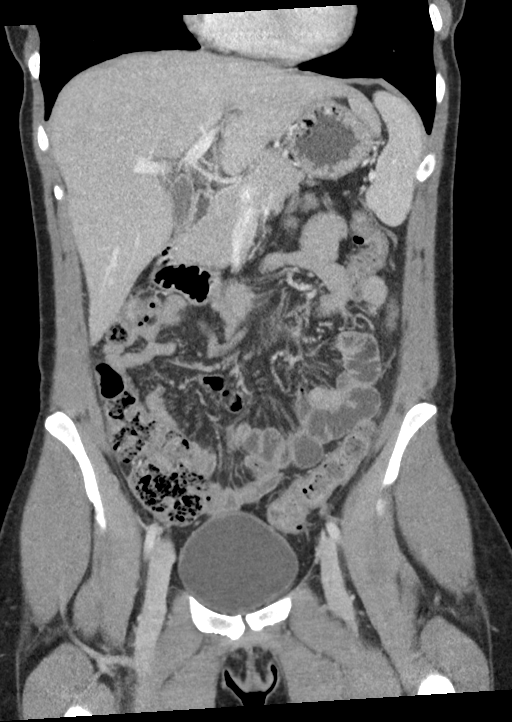
[im 33/75  soft-tissue]
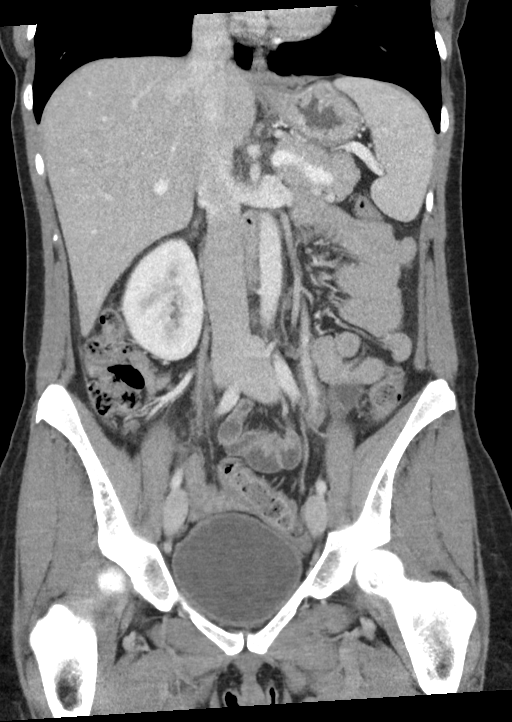
[im 42/75  soft-tissue]
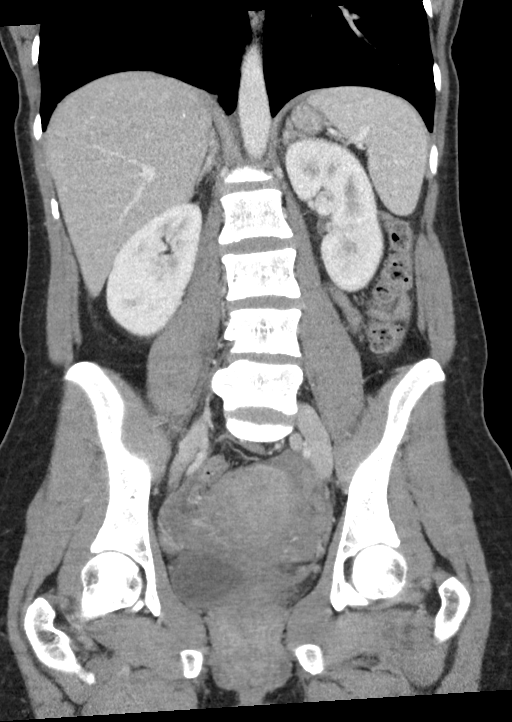

[16 of 46 positions shown; findings below may reference images not displayed]

FINDINGS: Lower chest: Normal

Hepatobiliary: Normal

Pancreas: Normal

Spleen: Normal

Adrenals/Urinary Tract: Adrenal glands are normal. Kidneys are
normal. No cyst, mass, stone or hydronephrosis. The bladder is
normal.

Stomach/Bowel: No abnormal bowel finding.

Vascular/Lymphatic: Normal

Reproductive: The uterus is normal. There are bilateral ovarian
cysts, likely functional. There is hyperdense fluid in the pelvic
cul de sac. This is consistent with localized pelvic hemorrhage.
Most common etiology would be a ruptured hemorrhagic ovarian cyst.

Other: No free air.

Musculoskeletal: Negative
IMPRESSION: Hemoperitoneum in the pelvic cul de sac surrounding the uterus and
ovaries, most consistent with ruptured hemorrhagic ovarian cyst. No
free air.

## 2020-05-27 ENCOUNTER — Telehealth: Payer: No Typology Code available for payment source | Admitting: Internal Medicine

## 2020-05-27 ENCOUNTER — Other Ambulatory Visit: Payer: Self-pay
